# Patient Record
Sex: Male | Born: 1970 | Race: White | Hispanic: No | Smoking: Never smoker
Health system: Southern US, Community
[De-identification: ages and names within clinical notes are randomized; demographics above are authoritative.]

## PROBLEM LIST (undated history)

## (undated) DIAGNOSIS — R011 Cardiac murmur, unspecified: Secondary | ICD-10-CM

## (undated) DIAGNOSIS — I219 Acute myocardial infarction, unspecified: Secondary | ICD-10-CM

## (undated) DIAGNOSIS — M109 Gout, unspecified: Secondary | ICD-10-CM

## (undated) DIAGNOSIS — I10 Essential (primary) hypertension: Secondary | ICD-10-CM

## (undated) DIAGNOSIS — I2510 Atherosclerotic heart disease of native coronary artery without angina pectoris: Secondary | ICD-10-CM

## (undated) HISTORY — DX: Atherosclerotic heart disease of native coronary artery without angina pectoris: I25.10

## (undated) HISTORY — DX: Acute myocardial infarction, unspecified: I21.9

## (undated) HISTORY — DX: Cardiac murmur, unspecified: R01.1

## (undated) HISTORY — PX: CORONARY ARTERY BYPASS GRAFT: SHX141

---

## 2012-01-19 ENCOUNTER — Emergency Department (HOSPITAL_COMMUNITY)
Admission: EM | Admit: 2012-01-19 | Discharge: 2012-01-19 | Disposition: A | Discharge status: Home / Self Care | Payer: Self-pay | Attending: Emergency Medicine | Admitting: Emergency Medicine

## 2012-01-19 ENCOUNTER — Encounter (HOSPITAL_COMMUNITY): Payer: Self-pay | Admitting: Emergency Medicine

## 2012-01-19 ENCOUNTER — Emergency Department (HOSPITAL_COMMUNITY): Payer: Self-pay

## 2012-01-19 DIAGNOSIS — Z79899 Other long term (current) drug therapy: Secondary | ICD-10-CM | POA: Insufficient documentation

## 2012-01-19 DIAGNOSIS — Z8639 Personal history of other endocrine, nutritional and metabolic disease: Secondary | ICD-10-CM | POA: Insufficient documentation

## 2012-01-19 DIAGNOSIS — J039 Acute tonsillitis, unspecified: Secondary | ICD-10-CM | POA: Insufficient documentation

## 2012-01-19 DIAGNOSIS — Z862 Personal history of diseases of the blood and blood-forming organs and certain disorders involving the immune mechanism: Secondary | ICD-10-CM | POA: Insufficient documentation

## 2012-01-19 DIAGNOSIS — K029 Dental caries, unspecified: Secondary | ICD-10-CM | POA: Insufficient documentation

## 2012-01-19 DIAGNOSIS — I10 Essential (primary) hypertension: Secondary | ICD-10-CM | POA: Insufficient documentation

## 2012-01-19 HISTORY — DX: Gout, unspecified: M10.9

## 2012-01-19 HISTORY — DX: Essential (primary) hypertension: I10

## 2012-01-19 LAB — CBC WITH DIFFERENTIAL/PLATELET
Eosinophils Absolute: 0 10*3/uL (ref 0.0–0.7)
Eosinophils Relative: 0 % (ref 0–5)
Hemoglobin: 16.5 g/dL (ref 13.0–17.0)
Lymphocytes Relative: 4 % — ABNORMAL LOW (ref 12–46)
Lymphs Abs: 0.8 10*3/uL (ref 0.7–4.0)
MCH: 27.6 pg (ref 26.0–34.0)
MCV: 80.1 fL (ref 78.0–100.0)
Monocytes Relative: 8 % (ref 3–12)
Platelets: 207 10*3/uL (ref 150–400)
RBC: 5.98 MIL/uL — ABNORMAL HIGH (ref 4.22–5.81)
WBC: 20.2 10*3/uL — ABNORMAL HIGH (ref 4.0–10.5)

## 2012-01-19 LAB — BASIC METABOLIC PANEL
BUN: 16 mg/dL (ref 6–23)
CO2: 25 mEq/L (ref 19–32)
Glucose, Bld: 121 mg/dL — ABNORMAL HIGH (ref 70–99)
Potassium: 3.6 mEq/L (ref 3.5–5.1)
Sodium: 138 mEq/L (ref 135–145)

## 2012-01-19 MED ORDER — HYDROMORPHONE HCL PF 1 MG/ML IJ SOLN
1.0000 mg | Freq: Once | INTRAMUSCULAR | Status: AC
  Administered 2012-01-19: 1 mg via INTRAVENOUS
  Filled 2012-01-19: qty 1

## 2012-01-19 MED ORDER — CLINDAMYCIN PHOSPHATE 600 MG/50ML IV SOLN
600.0000 mg | Freq: Once | INTRAVENOUS | Status: AC
  Administered 2012-01-19: 600 mg via INTRAVENOUS
  Filled 2012-01-19: qty 50

## 2012-01-19 MED ORDER — AMOXICILLIN-POT CLAVULANATE 875-125 MG PO TABS: 1.0000 | ORAL_TABLET | Freq: Two times a day (BID) | ORAL | Status: DC

## 2012-01-19 MED ORDER — LISINOPRIL 10 MG PO TABS: 10.0000 mg | ORAL_TABLET | Freq: Every day | ORAL | Status: DC

## 2012-01-19 MED ORDER — LISINOPRIL 10 MG PO TABS
10.0000 mg | ORAL_TABLET | Freq: Once | ORAL | Status: AC
  Administered 2012-01-19: 10 mg via ORAL
  Filled 2012-01-19: qty 1

## 2012-01-19 MED ORDER — AMOXICILLIN-POT CLAVULANATE 875-125 MG PO TABS
1.0000 | ORAL_TABLET | Freq: Once | ORAL | Status: AC
  Administered 2012-01-19: 1 via ORAL
  Filled 2012-01-19: qty 1

## 2012-01-19 MED ORDER — SODIUM CHLORIDE 0.9 % IV SOLN
INTRAVENOUS | Status: DC
  Administered 2012-01-19: 20:00:00 via INTRAVENOUS

## 2012-01-19 MED ORDER — OXYCODONE-ACETAMINOPHEN 5-325 MG PO TABS: 1.0000 | ORAL_TABLET | ORAL | Status: DC | PRN

## 2012-01-19 MED ORDER — ONDANSETRON HCL 4 MG/2ML IJ SOLN
4.0000 mg | Freq: Once | INTRAMUSCULAR | Status: AC
  Administered 2012-01-19: 4 mg via INTRAVENOUS
  Filled 2012-01-19: qty 2

## 2012-01-19 MED ORDER — LISINOPRIL 10 MG PO TABS
ORAL_TABLET | ORAL | Status: AC
  Filled 2012-01-19: qty 1

## 2012-01-19 MED ORDER — IOHEXOL 300 MG/ML  SOLN
70.0000 mL | Freq: Once | INTRAMUSCULAR | Status: AC | PRN
  Administered 2012-01-19: 70 mL via INTRAVENOUS

## 2012-01-19 NOTE — ED Notes (Signed)
Patient sleeping at this time. Arousable to speech. Denies pain at this time.

## 2012-01-19 NOTE — ED Notes (Signed)
Pt presents with redness and swelling to left side of face. Pt also reports difficulty swallowing. Denies difficulty breathing. Pt was seen at [facility] facility and told to come to ED for IV abx.

## 2012-01-19 NOTE — ED Notes (Signed)
Notified Dr. Effie Shy of patient's vital signs, including oxygen saturating dropping to 90% on room air. Patient placed on 2 liters of oxygen via nasal canula.

## 2012-01-19 NOTE — ED Notes (Signed)
Tiffany abbott, rn stated that blood pressure taken at 1641 was supposed to be 181/125 instead of 181/25

## 2012-01-19 NOTE — ED Provider Notes (Signed)
History     CSN: [id number]  Arrival date & time 01/19/12  1632   First MD Initiated Contact with Patient 01/19/12 1847      Chief Complaint  Patient presents with  . Abscess    (Consider location/radiation/quality/duration/timing/severity/associated sxs/prior treatment) HPI Comments: Jerry Young is a 41 y.o. Male  Who began having swelling, and pain in the left jaw, yesterday. Today. It swelled on the right beneath the mandible. He has chills without fever. He denies nausea, or vomiting. He has not had any recent dental care. He saw a doctor at an urgent care, who recommended. ER followup for evaluation and treatment. He has not had this previously. He's not taken any medication for the problem. It hurts to open the mouth, and hurts to touch the jaw.  Patient is a 41 y.o. male presenting with abscess. The history is provided by the patient.  Abscess     Past Medical History  Diagnosis Date  . Hypertension   . Gout     History reviewed. No pertinent past surgical history.  History reviewed. No pertinent family history.  History  Substance Use Topics  . Smoking status: Not on file  . Smokeless tobacco: Not on file  . Alcohol Use:       Review of Systems  All other systems reviewed and are negative.    Allergies  Review of patient's allergies indicates no known allergies.  Home Medications   Current Outpatient Rx  Name  Route  Sig  Dispense  Refill  . ACETAMINOPHEN 500 MG PO TABS   Oral   Take 1,500-2,000 mg by mouth every 6 (six) hours as needed. For pain         . IBUPROFEN 200 MG PO TABS   Oral   Take 600 mg by mouth every 6 (six) hours as needed. For pain         . AMOXICILLIN-POT CLAVULANATE 875-125 MG PO TABS   Oral   Take 1 tablet by mouth 2 (two) times daily.   20 tablet   0   . LISINOPRIL 10 MG PO TABS   Oral   Take 1 tablet (10 mg total) by mouth daily.   30 tablet   0   . OXYCODONE-ACETAMINOPHEN 5-325 MG PO TABS   Oral   Take 1  tablet by mouth every 4 (four) hours as needed for pain.   30 tablet   0   . OXYCODONE-ACETAMINOPHEN 5-325 MG PO TABS   Oral   Take 1 tablet by mouth every 4 (four) hours as needed for pain.   6 tablet   0     BP 208/124  Pulse 112  Temp 99.9 F (37.7 C) (Oral)  Resp 18  SpO2 93%  Physical Exam  Nursing note and vitals reviewed. Constitutional: He is oriented to person, place, and time. He appears well-developed and well-nourished. No distress.  HENT:  Head: Normocephalic and atraumatic.  Right Ear: External ear normal.  Left Ear: External ear normal.       Poor dentition. Large cavity left lower premolar. No associated swelling or abscess. Mild trismus secondary to left jaw pain. Mild, left to right submandibular swelling, indistinct without palpable abscess.  Eyes: Conjunctivae normal and EOM are normal. Pupils are equal, round, and reactive to light.  Neck: Normal range of motion and phonation normal. Neck supple.  Cardiovascular: Normal rate, regular rhythm, normal heart sounds and intact distal pulses.   Pulmonary/Chest: Effort normal and breath sounds  normal. He exhibits no bony tenderness.  Abdominal: Soft. Normal appearance. There is no tenderness.  Musculoskeletal: Normal range of motion.  Neurological: He is alert and oriented to person, place, and time. He has normal strength. No cranial nerve deficit or sensory deficit. He exhibits normal muscle tone. Coordination normal.  Skin: Skin is warm, dry and intact.  Psychiatric: He has a normal mood and affect. His behavior is normal. Judgment and thought content normal.    ED Course  Procedures (including critical care time)  Emergency department treatment: IV fluids, and IV clindamycin, IV Dilaudid, and Zofran.  Reevaluation: 21:20- he feels better after treatment. He is not drooling. He is able to walk to the bathroom.   Labs Reviewed  CBC WITH DIFFERENTIAL - Abnormal; Notable for the following:    WBC 20.2 (*)      RBC 5.98 (*)     Neutrophils Relative 88 (*)     Neutro Abs 17.8 (*)     Lymphocytes Relative 4 (*)     Monocytes Absolute 1.5 (*)     All other components within normal limits  BASIC METABOLIC PANEL - Abnormal; Notable for the following:    Glucose, Bld 121 (*)     GFR calc non Af Amer 72 (*)     GFR calc Af Amer 83 (*)     All other components within normal limits   Ct Soft Tissue Neck W Contrast  01/19/2012  *RADIOLOGY REPORT*  Clinical Data: Dental infection.  Left submandibular swelling and pain.  CT NECK WITH CONTRAST  Technique:  Multidetector CT imaging of the neck was performed with intravenous contrast.  Contrast: 70mL OMNIPAQUE IOHEXOL 300 MG/ML  SOLN  Comparison: None.  Findings: Asymmetric soft tissue swelling is seen involving the left submandibular salivary gland with adjacent inflammatory changes.  Shotty left submandibular and left upper internal jugular lymph nodes are also seen, measuring up to 11 mm in short axis, likely reactive in etiology. No abscess is identified.  There is mild asymmetric soft tissue swelling of the left palatine tonsil, however there is no evidence of peritonsillar abscess.  The other salivary glands and thyroid are normal in appearance.  Larynx and epiglottis are normal appearance.  IMPRESSION:  1.  Asymmetric swelling of the left submandibular gland and palatine tonsil, with shotty left submandibular and upper internal jugular lymphadenopathy. 2.  No evidence of abscess.   Original Report Authenticated By: Myles Rosenthal, M.D.      1. Tonsillitis   2. Dental caries   3. Hypertension       MDM  Nonspecific symptoms, with equivocal  CT findings. Unsuspected tonsillar swelling possibly consistent with streptococcal tonsillitis. Cannot rule out dental source for infection. No apparent abscess or impending airway obstruction. Antibiotics will be changed to Augmentin to cover for both dental and pharyngeal sources for infection. Doubt sepsis or  metabolic instability.    Plan: Home Medications- Augmentin, Percocet, Lisinopril; Home Treatments- Rest; Recommended follow up- Return for check up in 2 days    Flint Melter, MD 01/20/12 [phone]

## 2012-01-23 MED FILL — Oxycodone w/ Acetaminophen Tab 5-325 MG: ORAL | Qty: 6 | Status: AC

## 2016-03-05 ENCOUNTER — Encounter (HOSPITAL_COMMUNITY): Payer: Self-pay | Admitting: Emergency Medicine

## 2016-03-05 ENCOUNTER — Emergency Department (HOSPITAL_COMMUNITY)
Admission: EM | Admit: 2016-03-05 | Discharge: 2016-03-05 | Disposition: A | Discharge status: Home / Self Care | Payer: Managed Care, Other (non HMO) | Attending: Emergency Medicine | Admitting: Emergency Medicine

## 2016-03-05 DIAGNOSIS — M10061 Idiopathic gout, right knee: Secondary | ICD-10-CM | POA: Insufficient documentation

## 2016-03-05 DIAGNOSIS — I10 Essential (primary) hypertension: Secondary | ICD-10-CM | POA: Diagnosis not present

## 2016-03-05 DIAGNOSIS — F17220 Nicotine dependence, chewing tobacco, uncomplicated: Secondary | ICD-10-CM | POA: Diagnosis not present

## 2016-03-05 DIAGNOSIS — Z791 Long term (current) use of non-steroidal anti-inflammatories (NSAID): Secondary | ICD-10-CM | POA: Diagnosis not present

## 2016-03-05 DIAGNOSIS — Z79899 Other long term (current) drug therapy: Secondary | ICD-10-CM | POA: Insufficient documentation

## 2016-03-05 DIAGNOSIS — M25561 Pain in right knee: Secondary | ICD-10-CM | POA: Diagnosis present

## 2016-03-05 DIAGNOSIS — M109 Gout, unspecified: Secondary | ICD-10-CM

## 2016-03-05 MED ORDER — PREDNISONE 50 MG PO TABS
60.0000 mg | ORAL_TABLET | Freq: Once | ORAL | Status: AC
  Administered 2016-03-05: 60 mg via ORAL
  Filled 2016-03-05: qty 1

## 2016-03-05 MED ORDER — KETOROLAC TROMETHAMINE 30 MG/ML IJ SOLN
30.0000 mg | Freq: Once | INTRAMUSCULAR | Status: DC
  Filled 2016-03-05: qty 1

## 2016-03-05 MED ORDER — KETOROLAC TROMETHAMINE 30 MG/ML IJ SOLN
30.0000 mg | Freq: Once | INTRAMUSCULAR | Status: AC
  Administered 2016-03-05: 30 mg via INTRAMUSCULAR

## 2016-03-05 MED ORDER — PREDNISONE 10 MG PO TABS: ORAL_TABLET | ORAL | 0 refills | Status: DC

## 2016-03-05 NOTE — ED Triage Notes (Signed)
PT c/o right knee pain with no new injury that started x3-4 days ago. PT states he has a hx of gout and it tends to flare up to his right knee at times.

## 2016-03-05 NOTE — ED Provider Notes (Signed)
[facility]ER[facility] DEPT Provider Note   CSN: [id number] Arrival date & time: 03/05/16  1017     History   Chief Complaint Chief Complaint  Patient presents with  . Knee Pain    HPI Jerry Young is a 46 y.o. male with history of gout presents with right knee pain x 4 days. He describes it as worsening, sharp, radiating pain down his leg and up his leg, 10/10 pain. He states that moving his leg makes pain worse. He reports being able to slightly move his leg and wiggle his toes. Patient has tried appropriate at home with no relief. Patient reports that he has episodes "exactly" like this about every 6 months to the same knee and feels exactly same as every other episode, and he has had this for about 25 years. He states that he usually goes to the urgent care but was not able to since his urgent care was not open today. Patient reports his primary care physician monitors his gout. He reports never having his knee aspirated any of his previous gout attacks. Patient has not had any pain medication today. Patient denies trauma, fevers, chills, short of breath, chest pain, nausea, vomiting.  The history is provided by the patient. No language interpreter was used.    Past Medical History:  Diagnosis Date  . Gout   . Hypertension     There are no active problems to display for this patient.   History reviewed. No pertinent surgical history.     Home Medications    Prior to Admission medications   Medication Sig Start Date End Date Taking? Authorizing Provider  acetaminophen (TYLENOL) 500 MG tablet Take 1,500-2,000 mg by mouth every 6 (six) hours as needed. For pain    Historical Provider, MD  amoxicillin-clavulanate (AUGMENTIN) 875-125 MG per tablet Take 1 tablet by mouth 2 (two) times daily. 01/19/12   Mancel Bale, MD  ibuprofen (ADVIL,MOTRIN) 200 MG tablet Take 600 mg by mouth every 6 (six) hours as needed. For pain    Historical Provider, MD  lisinopril (PRINIVIL,ZESTRIL) 10 MG  tablet Take 1 tablet (10 mg total) by mouth daily. 01/19/12   Mancel Bale, MD  oxyCODONE-acetaminophen (PERCOCET) 5-325 MG per tablet Take 1 tablet by mouth every 4 (four) hours as needed for pain. 01/19/12   Mancel Bale, MD  oxyCODONE-acetaminophen (PERCOCET/ROXICET) 5-325 MG per tablet Take 1 tablet by mouth every 4 (four) hours as needed for pain. 01/19/12   Mancel Bale, MD  predniSONE (DELTASONE) 10 MG tablet Take 6 tabs day 1 and 2, 5 tabs day 3 and 4, 4 tabs day 5 and 6, 3 tabs day 7 and 8, 2 tabs day 9 and 10, and 1 tablet on day 11 and 12 03/05/16   Jerry Young, [place]    Family History History reviewed. No pertinent family history.  Social History Social History  Substance Use Topics  . Smoking status: Never Smoker  . Smokeless tobacco: Current User    Types: Chew  . Alcohol use No     Allergies   Patient has no known allergies.   Review of Systems Review of Systems  Constitutional: Negative for chills and fever.  Respiratory: Negative for shortness of breath.   Cardiovascular: Negative for chest pain.  Gastrointestinal: Negative for abdominal pain, diarrhea, nausea and vomiting.  Genitourinary: Negative for difficulty urinating and dysuria.  Musculoskeletal: Positive for arthralgias (Right knee), gait problem (secondary to pain) and myalgias (secondary to right knee joint pain). Negative for  neck pain and neck stiffness.  Skin: Negative for color change, rash and wound.     Physical Exam Updated Vital Signs BP (!) 169/111   Pulse 99   Temp 97.8 F (36.6 C) (Oral)   Resp 16   Ht 5\' 10"  (1.778 m)   Wt 95.3 kg   SpO2 96%   BMI 30.13 kg/m   Physical Exam  Constitutional: He is oriented to person, place, and time. He appears well-developed and well-nourished.  Well-appearing. Uncomfortable when moving right knee.  HENT:  Head: Normocephalic and atraumatic.  Eyes: EOM are normal.  Neck: Normal range of motion.  Cardiovascular: Normal rate, normal  heart sounds and intact distal pulses.   Pulmonary/Chest: Effort normal and breath sounds normal. No respiratory distress.  Abdominal: Soft. There is no tenderness. There is no rebound and no guarding.  Musculoskeletal: He exhibits tenderness.  Right knee his swollen, warm, and tender to palpation. No redness to right knee.  Neurological: He is alert and oriented to person, place, and time.  Skin: Skin is warm. No rash noted. No erythema.  Psychiatric: He has a normal mood and affect. His behavior is normal.  Nursing note and vitals reviewed.    ED Treatments / Results  Labs (all labs ordered are listed, but only abnormal results are displayed) Labs Reviewed - No data to display  EKG  EKG Interpretation None       Radiology No results found.  Procedures Procedures (including critical care time)  Medications Ordered in ED Medications  predniSONE (DELTASONE) tablet 60 mg (60 mg Oral Given 03/05/16 1359)  ketorolac (TORADOL) 30 MG/ML injection 30 mg (30 mg Intramuscular Given 03/05/16 1359)     Initial Impression / Assessment and Plan / ED Course  I have reviewed the triage vital signs and the nursing notes.  Pertinent labs & imaging results that were available during my care of the patient were reviewed by me and considered in my medical decision making (see chart for details).    Pt presents with monoarticular knee pain, swelling. No erythema.  Pt is afebrile and stable. Patient denied trauma and states this is the exact same presentation as it usually is about every 6 months. Pt Given Toradol and prednisone here in the ED, which he states he normally gets at the urgent care and helps him every time he has these symptoms.  Pt dc with prednisone for 12 days on a tapered dose due to significant history of gout and presentation. Advised that a longer dose of prednisone is indicated with his symptoms to prevent rebound gout. He has already tried ibuprofen at home with no relief.  Elevated blood pressure likely secondary to pain. Patient agreed with assessment and plan. Patient advised to, Dr. Romeo AppleHarrison on Monday for follow-up on today's visit. Patient also advised to follow up with primary care physician in 2-5 days for today's visit and for blood pressure treatment. Return precautions given for any new or worsening symptoms.   Final Clinical Impressions(s) / ED Diagnoses   Final diagnoses:  Acute gout of right knee, unspecified cause    New Prescriptions Discharge Medication List as of 03/05/2016  2:26 PM    START taking these medications   Details  predniSONE (DELTASONE) 10 MG tablet Take 6 tabs day 1 and 2, 5 tabs day 3 and 4, 4 tabs day 5 and 6, 3 tabs day 7 and 8, 2 tabs day 9 and 10, and 1 tablet on day 11 and 12, Print  [address] [place], [place] 03/05/16 1738    Cathren Laine, MD 03/06/16 1011

## 2016-03-05 NOTE — Discharge Instructions (Signed)
Please take prednisone as prescribed. It is a tapered dose needs to be taken every single day as shown. Use cold compress to the knee. Please follow up with orthopedic in 2 days due to frequency. Please follow up with her primary care physician in a week as needed. Maintain proper gout diet.  Contact a health care provider if: You have another gout attack. You continue to have symptoms of a gout attack after10 days of treatment. You have side effects from your medicines. You have chills or a fever. You have burning pain when you urinate. You have pain in your lower back or belly. Get help right away if: You have severe or uncontrolled pain. You cannot urinate.

## 2017-12-29 ENCOUNTER — Ambulatory Visit: Payer: Self-pay | Admitting: Family Medicine

## 2017-12-29 VITALS — BP 150/82 | HR 99 | Temp 98.6°F | Wt 214.8 lb

## 2017-12-29 DIAGNOSIS — M109 Gout, unspecified: Secondary | ICD-10-CM

## 2017-12-29 MED ORDER — PREDNISONE 20 MG PO TABS: 60.0000 mg | ORAL_TABLET | Freq: Every day | ORAL | 0 refills | Status: AC

## 2017-12-29 MED ORDER — PREDNISONE 10 MG (21) PO TBPK: ORAL_TABLET | ORAL | 0 refills | Status: DC

## 2017-12-29 NOTE — Progress Notes (Signed)
Jerry Young is a 47 y.o. male who presents today with concerns of pain related to a gout flare he reports not having a primary care provider for some time and treating his flares of gout every so often at an urgent care location. He reports a comorbid condition of hypertension that he is not currently medicated for. He reports increasing pain and symptoms for the last 4 days. He reports he believed that he could get through flare without treatment.  Review of Systems  Constitutional: Negative for chills, fever and malaise/fatigue.  HENT: Negative for congestion, ear discharge, ear pain, sinus pain and sore throat.   Eyes: Negative.   Respiratory: Negative for cough, sputum production and shortness of breath.   Cardiovascular: Negative.  Negative for chest pain.  Gastrointestinal: Negative for abdominal pain, diarrhea, nausea and vomiting.  Genitourinary: Negative for dysuria, frequency, hematuria and urgency.  Musculoskeletal: Positive for joint pain. Negative for myalgias.  Skin: Negative.   Neurological: Negative for headaches.  Endo/Heme/Allergies: Negative.   Psychiatric/Behavioral: Negative.     O: Vitals:   12/29/17 1213  BP: (!) 150/82  Pulse: 99  Temp: 98.6 F (37 C)     Physical Exam  Constitutional: He is oriented to person, place, and time. Vital signs are normal. He appears well-developed and well-nourished. He is active.  Non-toxic appearance. He does not have a sickly appearance.  HENT:  Head: Normocephalic.  Right Ear: Hearing, tympanic membrane, external ear and ear canal normal.  Left Ear: Hearing, tympanic membrane, external ear and ear canal normal.  Nose: Nose normal.  Mouth/Throat: Uvula is midline and oropharynx is clear and moist.  Neck: Normal range of motion. Neck supple.  Cardiovascular: Normal rate, regular rhythm, normal heart sounds and normal pulses.  Pulmonary/Chest: Effort normal and breath sounds normal.  Abdominal: Soft. Bowel sounds are  normal.  Musculoskeletal: Normal range of motion.       Right forearm: He exhibits tenderness, bony tenderness, swelling, edema and deformity.       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   Lymphadenopathy:       Head (right side): No submental and no submandibular adenopathy present.       Head (left side): No submental and no submandibular adenopathy present.    He has no cervical adenopathy.  Neurological: He is alert and oriented to person, place, and time.  Psychiatric: He has a normal mood and affect.  Vitals reviewed.  A: 1. Acute gout of right elbow, unspecified cause    P: Discussed exam findings, diagnosis etiology and medication use and indications reviewed with patient. Follow- Up and discharge instructions provided. No emergent/urgent issues found on exam.  Patient verbalized understanding of information provided and agrees with plan of care (POC), all questions answered.  Discussed concern due to the frequency of care and the lack of a PCP potentially causing more frequent exacerbations than necessary and the risk to joint damage and destruction due to elevated uric acid levels. Provided patient with multiple low cost treatment options due to lack of insurance currently. Treated with OTC NSAID regimen and prednisone. Discussed the benefit of daily allopurinol and patient reports historically multiple treatments attempted without real reduction in exacerbations or blood levels. Provided information of diet modification and [facility].  1. Acute gout of right elbow, unspecified cause -  predniSONE (DELTASONE) 20 MG tablet; Take 3 tablets (60 mg total) by mouth daily with breakfast for 3 days. - predniSONE (STERAPRED UNI-PAK 21 TAB) 10 MG (21) TBPK tablet; Start after 3 day burst of prednisone 60 mg.  Take 6 tablets on day 1 and then 1 tablet each day thereafter. 6,5,4,3,2,1

## 2017-12-29 NOTE — Patient Instructions (Signed)

## 2018-01-01 ENCOUNTER — Telehealth: Payer: Self-pay

## 2018-01-01 NOTE — Telephone Encounter (Signed)
I was no able to contacted the patient. 

## 2019-07-24 ENCOUNTER — Emergency Department (HOSPITAL_COMMUNITY)
Admission: EM | Admit: 2019-07-24 | Discharge: 2019-07-25 | Disposition: A | Discharge status: Home / Self Care | Payer: BLUE CROSS/BLUE SHIELD | Attending: Emergency Medicine | Admitting: Emergency Medicine

## 2019-07-24 ENCOUNTER — Emergency Department (HOSPITAL_COMMUNITY): Payer: BLUE CROSS/BLUE SHIELD

## 2019-07-24 ENCOUNTER — Other Ambulatory Visit: Payer: Self-pay

## 2019-07-24 ENCOUNTER — Encounter (HOSPITAL_COMMUNITY): Payer: Self-pay | Admitting: Emergency Medicine

## 2019-07-24 DIAGNOSIS — Z79899 Other long term (current) drug therapy: Secondary | ICD-10-CM | POA: Insufficient documentation

## 2019-07-24 DIAGNOSIS — N289 Disorder of kidney and ureter, unspecified: Secondary | ICD-10-CM

## 2019-07-24 DIAGNOSIS — F17220 Nicotine dependence, chewing tobacco, uncomplicated: Secondary | ICD-10-CM | POA: Diagnosis not present

## 2019-07-24 DIAGNOSIS — M25562 Pain in left knee: Secondary | ICD-10-CM | POA: Insufficient documentation

## 2019-07-24 DIAGNOSIS — M109 Gout, unspecified: Secondary | ICD-10-CM | POA: Diagnosis not present

## 2019-07-24 DIAGNOSIS — I10 Essential (primary) hypertension: Secondary | ICD-10-CM | POA: Insufficient documentation

## 2019-07-24 DIAGNOSIS — M25569 Pain in unspecified knee: Secondary | ICD-10-CM

## 2019-07-24 NOTE — ED Triage Notes (Signed)
Pt presents to ED POV. Pt hx of gout states he has flare up in L knee. Pt states pain is so severe he can not walk on it. Pt could not be seen by PCP.

## 2019-07-25 LAB — BASIC METABOLIC PANEL
Anion gap: 16 — ABNORMAL HIGH (ref 5–15)
BUN: 27 mg/dL — ABNORMAL HIGH (ref 6–20)
CO2: 20 mmol/L — ABNORMAL LOW (ref 22–32)
Calcium: 9 mg/dL (ref 8.9–10.3)
Chloride: 94 mmol/L — ABNORMAL LOW (ref 98–111)
Creatinine, Ser: 1.67 mg/dL — ABNORMAL HIGH (ref 0.61–1.24)
GFR calc Af Amer: 54 mL/min — ABNORMAL LOW (ref >60)
GFR calc non Af Amer: 47 mL/min — ABNORMAL LOW (ref >60)
Glucose, Bld: 92 mg/dL (ref 70–99)
Potassium: 4.1 mmol/L (ref 3.5–5.1)
Sodium: 130 mmol/L — ABNORMAL LOW (ref 135–145)

## 2019-07-25 LAB — CBC
HCT: 47.3 % (ref 39.0–52.0)
Hemoglobin: 15.2 g/dL (ref 13.0–17.0)
MCH: 27.8 pg (ref 26.0–34.0)
MCHC: 32.1 g/dL (ref 30.0–36.0)
MCV: 86.5 fL (ref 80.0–100.0)
Platelets: 283 10*3/uL (ref 150–400)
RBC: 5.47 MIL/uL (ref 4.22–5.81)
RDW: 13.4 % (ref 11.5–15.5)
WBC: 10.9 10*3/uL — ABNORMAL HIGH (ref 4.0–10.5)
nRBC: 0 % (ref 0.0–0.2)

## 2019-07-25 MED ORDER — PREDNISONE 50 MG PO TABS: 50.0000 mg | ORAL_TABLET | Freq: Every day | ORAL | 0 refills | Status: DC

## 2019-07-25 MED ORDER — HYDROCODONE-ACETAMINOPHEN 5-325 MG PO TABS: 1.0000 | ORAL_TABLET | ORAL | 0 refills | Status: DC | PRN

## 2019-07-25 MED ORDER — OXYCODONE-ACETAMINOPHEN 5-325 MG PO TABS
1.0000 | ORAL_TABLET | Freq: Once | ORAL | Status: AC
  Administered 2019-07-25: 1 via ORAL
  Filled 2019-07-25: qty 1

## 2019-07-25 MED ORDER — COLCHICINE 0.6 MG PO CAPS: 0.6000 mg | ORAL_CAPSULE | Freq: Every day | ORAL | 0 refills | Status: DC

## 2019-07-25 MED ORDER — COLCHICINE 0.6 MG PO TABS
0.6000 mg | ORAL_TABLET | Freq: Once | ORAL | Status: AC
  Administered 2019-07-25: 0.6 mg via ORAL
  Filled 2019-07-25: qty 1

## 2019-07-25 MED ORDER — PREDNISONE 20 MG PO TABS
60.0000 mg | ORAL_TABLET | Freq: Once | ORAL | Status: AC
  Administered 2019-07-25: 60 mg via ORAL
  Filled 2019-07-25: qty 3

## 2019-07-25 NOTE — Discharge Instructions (Signed)
When you see your rheumatologist, please discuss treatment options to try to prevent future flareups of your gout.

## 2019-07-25 NOTE — ED Provider Notes (Signed)
[facility] EMERGENCY DEPARTMENT Provider Note   CSN: [id number] Arrival date & time: 07/24/19  2310   History Chief Complaint  Patient presents with  . Knee Pain    Jerry Young is a 49 y.o. male.  The history is provided by the patient.  Knee Pain He has history of hypertension and gout and comes in with a 3-day history of pain in his left knee typical of his gout.  He has had several flares of his gout recently.  He is supposed to see a rheumatologist, but initial appointment has not yet been made.  He denies any trauma.  He denies fever or chills.  Pain is rated at 10/10.  He has tried taking ibuprofen, but without relief.  Past Medical History:  Diagnosis Date  . Gout   . Hypertension     There are no problems to display for this patient.   History reviewed. No pertinent surgical history.     History reviewed. No pertinent family history.  Social History   Tobacco Use  . Smoking status: Never Smoker  . Smokeless tobacco: Current User    Types: Chew  Substance Use Topics  . Alcohol use: No  . Drug use: No    Home Medications Prior to Admission medications   Medication Sig Start Date End Date Taking? Authorizing Provider  acetaminophen (TYLENOL) 500 MG tablet Take 1,500-2,000 mg by mouth every 6 (six) hours as needed. For pain    [provider]  amoxicillin-clavulanate (AUGMENTIN) 875-125 MG per tablet Take 1 tablet by mouth 2 (two) times daily. Patient not taking: Reported on 12/29/2017 01/19/12   Daleen Bo, MD  ibuprofen (ADVIL,MOTRIN) 200 MG tablet Take 600 mg by mouth every 6 (six) hours as needed. For pain    [provider]  lisinopril (PRINIVIL,ZESTRIL) 10 MG tablet Take 1 tablet (10 mg total) by mouth daily. Patient not taking: Reported on 12/29/2017 01/19/12   Daleen Bo, MD  oxyCODONE-acetaminophen (PERCOCET) 5-325 MG per tablet Take 1 tablet by mouth every 4 (four) hours as needed for pain. Patient not  taking: Reported on 12/29/2017 01/19/12   Daleen Bo, MD  oxyCODONE-acetaminophen (PERCOCET/ROXICET) 5-325 MG per tablet Take 1 tablet by mouth every 4 (four) hours as needed for pain. Patient not taking: Reported on 12/29/2017 01/19/12   Daleen Bo, MD  predniSONE (STERAPRED UNI-PAK 21 TAB) 10 MG (21) TBPK tablet Start after 3 day burst of prednisone 60 mg. Take 6 tablets on day 1 and then 1 tablet each day thereafter. 0,3,4,7,4,2 12/29/17   Shella Maxim, NP    Allergies    Patient has no known allergies.  Review of Systems   Review of Systems  All other systems reviewed and are negative.   Physical Exam Updated Vital Signs BP 110/88   Pulse 88   Temp 98 F (36.7 C) (Oral)   Resp 18   Ht 5\' 11"  (1.803 m)   Wt 90.7 kg   SpO2 100%   BMI 27.89 kg/m   Physical Exam Vitals and nursing note reviewed.   49 year old male, resting comfortably and in no acute distress. Vital signs are normal. Oxygen saturation is 100%, which is normal. Head is normocephalic and atraumatic. PERRLA, EOMI. Oropharynx is clear. Neck is nontender and supple without adenopathy or JVD. Back is nontender and there is no CVA tenderness. Lungs are clear without rales, wheezes, or rhonchi. Chest is nontender. Heart has regular rate and rhythm without murmur. Abdomen is soft,  flat, nontender without masses or hepatosplenomegaly and peristalsis is normoactive. Extremities: Left knee has a moderate to large effusion with mild warmth but no erythema.  It is tender to palpation.  Remainder of extremity exam is unremarkable. Skin is warm and dry without rash. Neurologic: Mental status is normal, cranial nerves are intact, there are no motor or sensory deficits.  ED Results / Procedures / Treatments   Labs (all labs ordered are listed, but only abnormal results are displayed) Labs Reviewed  CBC - Abnormal; Notable for the following components:      Result Value   WBC 10.9 (*)    All other components  within normal limits  BASIC METABOLIC PANEL - Abnormal; Notable for the following components:   Sodium 130 (*)    Chloride 94 (*)    CO2 20 (*)    BUN 27 (*)    Creatinine, Ser 1.67 (*)    GFR calc non Af Amer 47 (*)    GFR calc Af Amer 54 (*)    Anion gap 16 (*)    All other components within normal limits   Radiology DG Knee 1-2 Views Left  Result Date: 07/24/2019 CLINICAL DATA:  Left knee pain. EXAM: LEFT KNEE - 1-2 VIEW COMPARISON:  None. FINDINGS: No evidence of an acute fracture or dislocation. There is mild medial tibiofemoral compartment space narrowing. Mild to moderate severity lateral tibiofemoral compartment space narrowing is also seen. A small to moderate sized joint effusion is noted. A 3.9 cm x 2.2 cm focal area of soft tissue swelling is seen anterior to the inferior aspect of the left patella. A 1.7 cm x 8.9 cm curvilinear area of mildly increased opacification is seen overlying the posterior soft tissues on the lateral view. IMPRESSION: 1. Mild to moderate severity degenerative changes. 2. Small to moderate sized joint effusion. 3. Focal area of soft tissue swelling anterior to the inferior aspect of the left patella. 4. Curvilinear area of mildly increased opacification overlying the posterior soft tissues of the left knee. MRI correlation is recommended. Electronically Signed   By: Aram Candela M.D.   On: 07/24/2019 23:48    Procedures Procedures  Medications Ordered in ED Medications  oxyCODONE-acetaminophen (PERCOCET/ROXICET) 5-325 MG per tablet 1 tablet (1 tablet Oral Given 07/25/19 0424)  predniSONE (DELTASONE) tablet 60 mg (60 mg Oral Given 07/25/19 0424)  colchicine tablet 0.6 mg (0.6 mg Oral Given 07/25/19 0424)    ED Course  I have reviewed the triage vital signs and the nursing notes.  Pertinent labs & imaging results that were available during my care of the patient were reviewed by me and considered in my medical decision making (see chart for  details).  MDM Rules/Calculators/A&P Acute gouty arthritis of the left knee.  Old records are reviewed, and he has been seen for gout flareups 2 other times in the last 2 months, each time treated with a course of prednisone.  He does have documented elevated uric acid levels on record.  X-ray shows degenerative changes and effusion.  Labs show mild renal insufficiency which appears to be new.  Last creatinine on record in the [facility] system was in 2013 and was normal.  On reviewing care everywhere, basic metabolic panel at [facility] on 07/12/2019, creatinine was 1.19.  Given the elevated creatinine, patient is advised not to use NSAIDs.  With multiple gout flareups over the last several months, I want to give him something to try to prevent flareups  until he can see the rheumatologist.  He is given a dose of prednisone, oxycodone-acetaminophen, colchicine and is discharged with prescriptions for prednisone, hydrocodone-acetaminophen, and colchicine.  He is encouraged to make the appointment with a rheumatologist as soon as possible.  I feel he would definitely benefit from being prescribed uric acid lowering agents, but these cannot be started during an acute flareup.  Final Clinical Impression(s) / ED Diagnoses Final diagnoses:  Knee pain    Rx / DC Orders ED Discharge Orders         Ordered    predniSONE (DELTASONE) 50 MG tablet  Daily     Discontinue  Reprint     07/25/19 0423    Colchicine (MITIGARE) 0.6 MG CAPS  Daily     Discontinue  Reprint     07/25/19 0423    HYDROcodone-acetaminophen (NORCO) 5-325 MG tablet  Every 4 hours PRN     Discontinue  Reprint     07/25/19 0425           Dione Booze, MD 07/25/19 [phone]

## 2019-10-01 ENCOUNTER — Telehealth (HOSPITAL_COMMUNITY): Payer: Self-pay

## 2019-10-01 NOTE — Telephone Encounter (Signed)
Pt insurance is active and benefits verified through [employer]. Co-pay $0.00, DED $12,500.00/$0.00 met, out of pocket $99,999,999.00 (OFP is unlimited)/$0.00 met, co-insurance 60%. No pre-authorization required. [employer]port, 10/01/19 @ 2:05PM, KMM#[id number]  Will contact patient to see if he is interested in the Cardiac Rehab Program. If interested, patient will need to complete follow up appt. Once completed, patient will be contacted for scheduling upon review by the RN Navigator.

## 2019-10-01 NOTE — Telephone Encounter (Signed)
Attempted to call patient in regards to Cardiac Rehab - LM on VM 

## 2019-10-17 ENCOUNTER — Encounter (HOSPITAL_COMMUNITY): Payer: Self-pay

## 2019-11-13 ENCOUNTER — Telehealth (HOSPITAL_COMMUNITY): Payer: Self-pay

## 2019-11-13 NOTE — Telephone Encounter (Signed)
No response from pt regarding CR.  Closed referral.  

## 2020-09-23 HISTORY — PX: OTHER SURGICAL HISTORY: SHX169

## 2021-10-28 IMAGING — CR DG KNEE 1-2V*L*
2 series · 2 of 2 positions shown · non-contrast
Comparison: None.

CLINICAL DATA: Left knee pain.

EXAM:
LEFT KNEE - 1-2 VIEW

[knee ap]
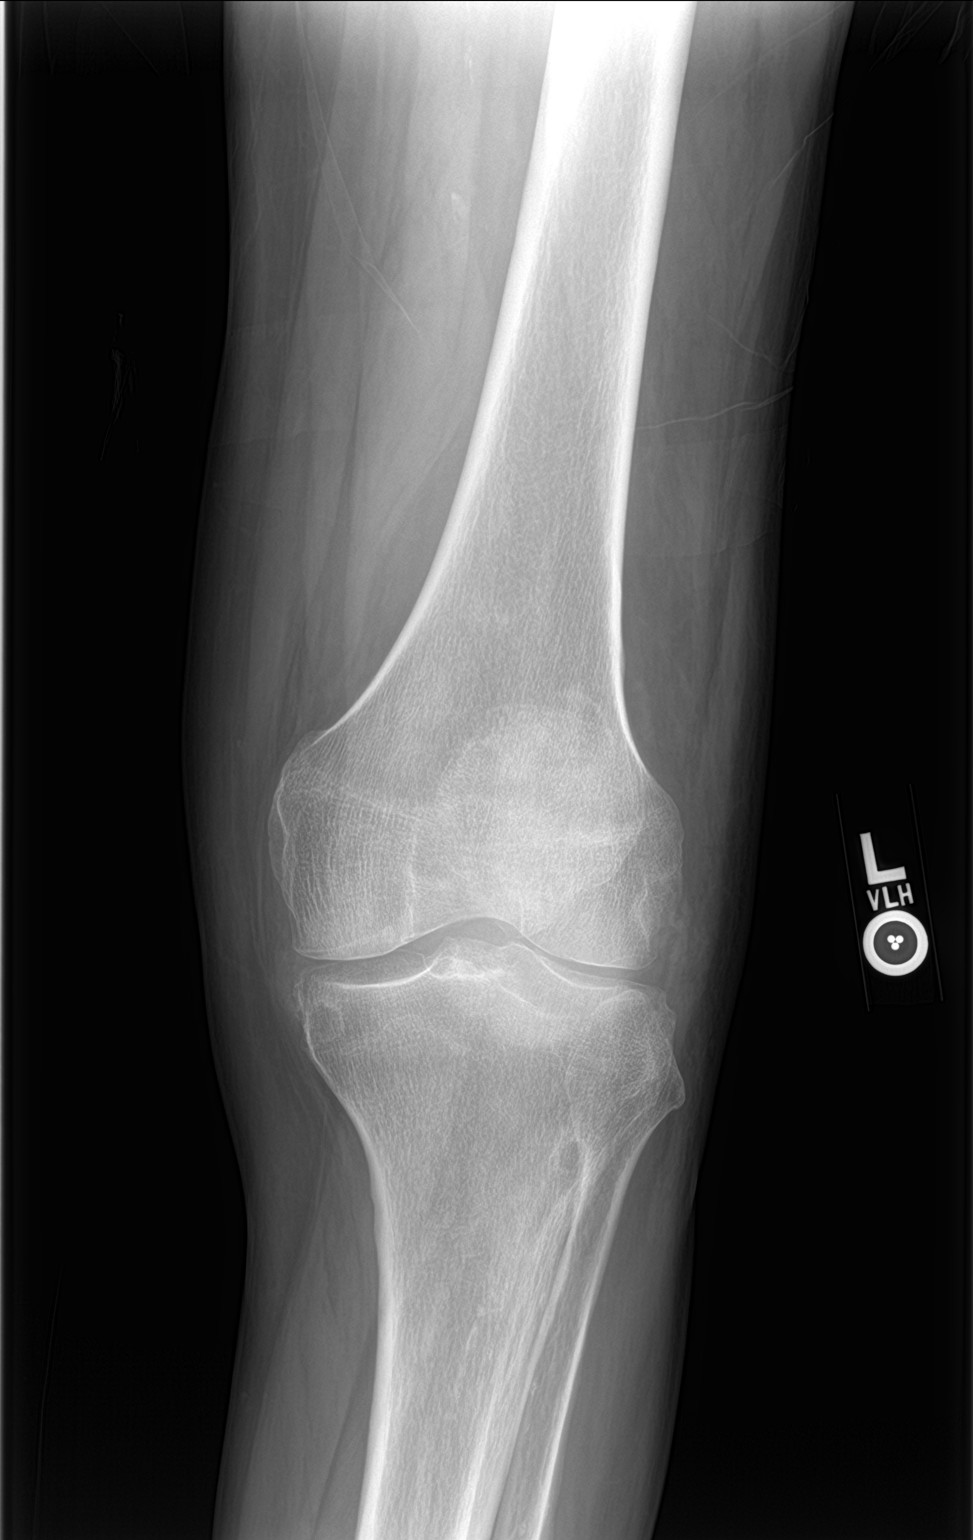

[knee lat]
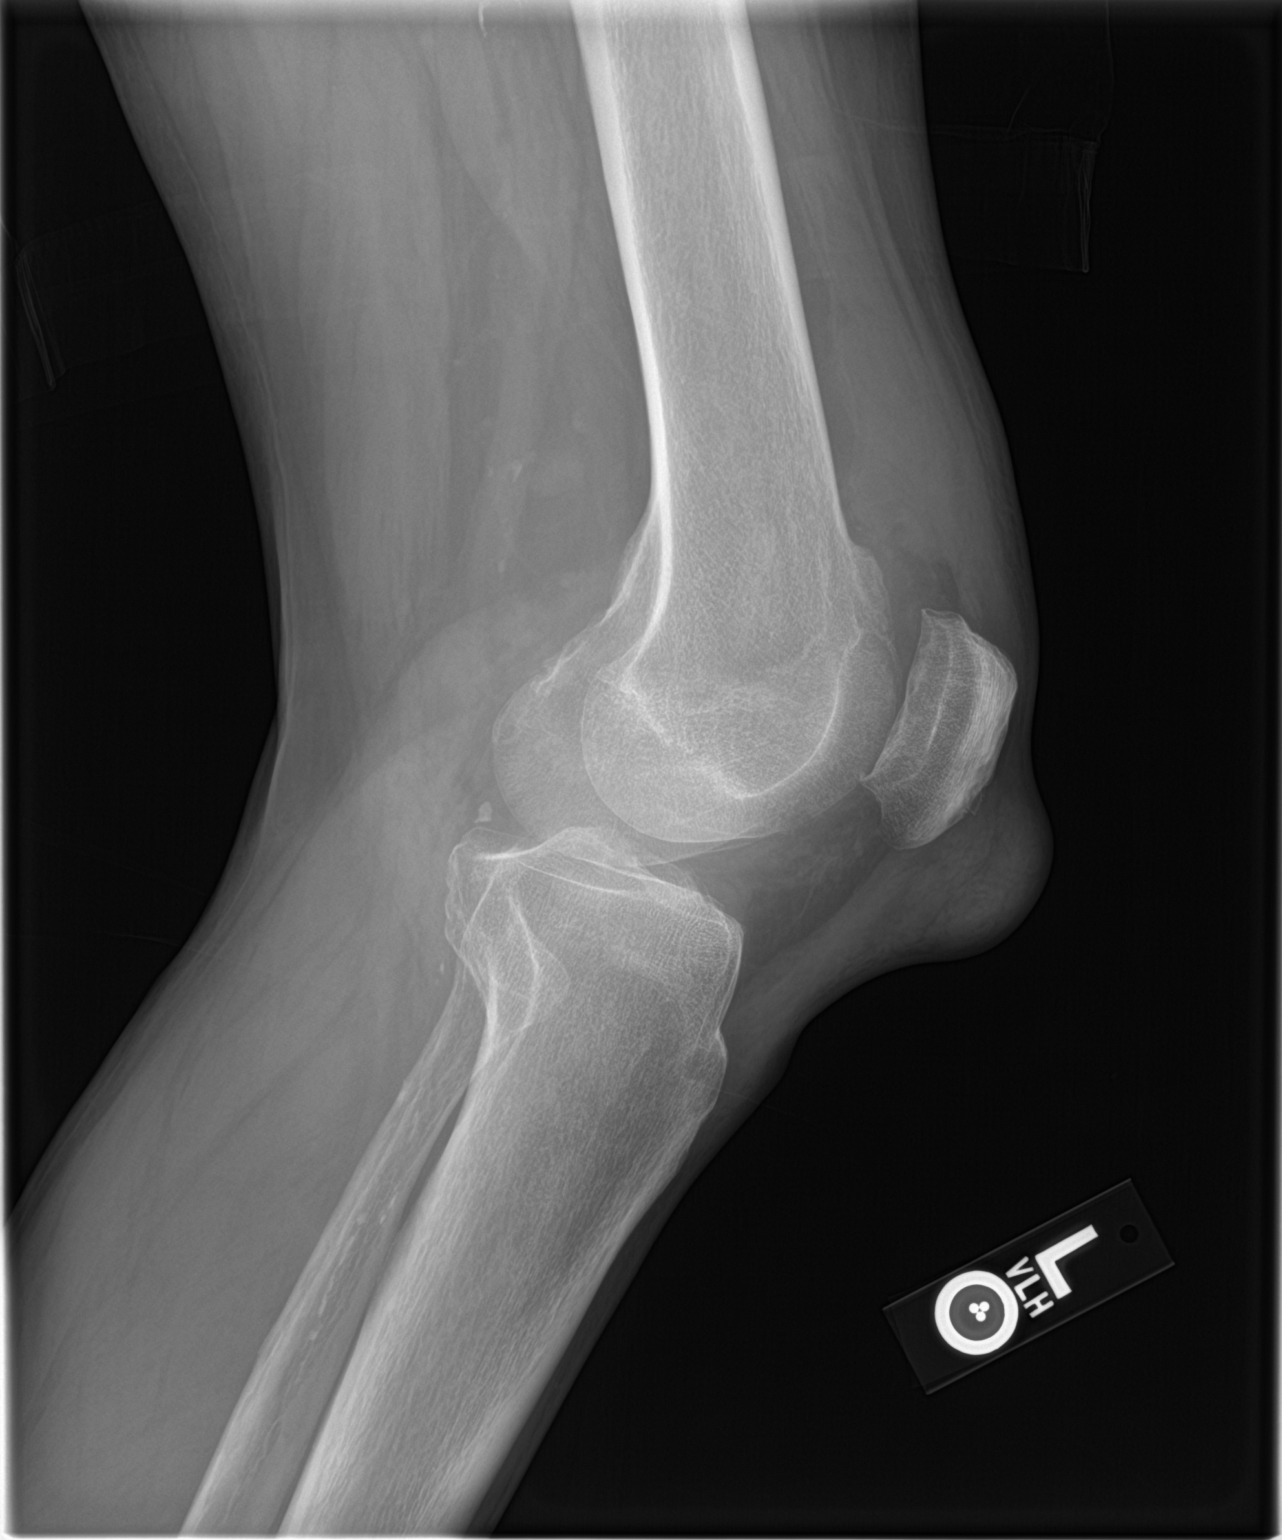

[2 of 2 positions shown; findings below may reference images not displayed]

FINDINGS: No evidence of an acute fracture or dislocation.

There is mild medial tibiofemoral compartment space narrowing. Mild
to moderate severity lateral tibiofemoral compartment space
narrowing is also seen.

A small to moderate sized joint effusion is noted.

A 3.9 cm x 2.2 cm focal area of soft tissue swelling is seen
anterior to the inferior aspect of the left patella.

A 1.7 cm x 8.9 cm curvilinear area of mildly increased opacification
is seen overlying the posterior soft tissues on the lateral view.
IMPRESSION: 1. Mild to moderate severity degenerative changes.
2. Small to moderate sized joint effusion.
3. Focal area of soft tissue swelling anterior to the inferior
aspect of the left patella.
4. Curvilinear area of mildly increased opacification overlying the
posterior soft tissues of the left knee. MRI correlation is
recommended.

## 2022-05-17 ENCOUNTER — Ambulatory Visit (HOSPITAL_COMMUNITY)
Admission: RE | Admit: 2022-05-17 | Discharge: 2022-05-17 | Disposition: A | Discharge status: Home / Self Care | Payer: Medicaid Other | Source: Ambulatory Visit | Attending: Family Medicine | Admitting: Family Medicine

## 2022-05-17 ENCOUNTER — Encounter (HOSPITAL_COMMUNITY): Payer: Self-pay

## 2022-05-17 VITALS — BP 150/90 | HR 77 | Temp 98.2°F | Resp 18

## 2022-05-17 DIAGNOSIS — M10069 Idiopathic gout, unspecified knee: Secondary | ICD-10-CM | POA: Diagnosis not present

## 2022-05-17 MED ORDER — TRAMADOL HCL 50 MG PO TABS: 50.0000 mg | ORAL_TABLET | Freq: Four times a day (QID) | ORAL | 0 refills | Status: DC | PRN

## 2022-05-17 MED ORDER — PREDNISONE 20 MG PO TABS: 40.0000 mg | ORAL_TABLET | Freq: Every day | ORAL | 0 refills | Status: AC

## 2022-05-17 MED ORDER — COLCHICINE 0.6 MG PO TABS: 0.6000 mg | ORAL_TABLET | Freq: Every day | ORAL | 0 refills | Status: DC | PRN

## 2022-05-17 MED ORDER — METHYLPREDNISOLONE ACETATE 80 MG/ML IJ SUSP
80.0000 mg | Freq: Once | INTRAMUSCULAR | Status: AC
  Administered 2022-05-17: 80 mg via INTRAMUSCULAR

## 2022-05-17 MED ORDER — METHYLPREDNISOLONE SODIUM SUCC 125 MG IJ SOLR
INTRAMUSCULAR | Status: AC
  Filled 2022-05-17: qty 2

## 2022-05-17 MED ORDER — METHYLPREDNISOLONE ACETATE 80 MG/ML IJ SUSP
INTRAMUSCULAR | Status: AC
  Filled 2022-05-17: qty 1

## 2022-05-17 NOTE — ED Triage Notes (Signed)
Pt states that he his having a gout flare in both knees and left ankle since Friday. He states he use to take colchicine but his new MD didn't refill it.

## 2022-05-17 NOTE — Discharge Instructions (Addendum)
You have been given a shot of Depo-Medrol 80 mg in the office  Take prednisone 20 mg--2 daily for 5 days  Colchicine 0.6 mg--1 tablet daily as needed for gout pain  Take tramadol 50 mg-- 1 tablet every 6 hours as needed for pain.  This medication can make you sleepy or dizzy

## 2022-05-17 NOTE — ED Provider Notes (Signed)
[facility]    CSN: [id number] Arrival date & time: 05/17/22  1342      History   Chief Complaint Chief Complaint  Patient presents with   Knee Pain    Entered by patient   Gout    HPI Jerry Young. is a 52 y.o. male.    Knee Pain  Here for pain in both knees and down his left ankle.  It began bothering him about 4 to 5 days ago.  He is having little swelling in the upper portion of his knees.  No fever or chills.  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  His last physician did not prescribe colchicine for him which is what he usually takes.  He does not have any history of diabetes.  Last EGFR in early 2023 was 60 and normal  Past Medical History:  Diagnosis Date   Gout    Hypertension     There are no problems to display for this patient.   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 Authorizing Provider  allopurinol (ZYLOPRIM) 100 MG tablet Take 100 mg by mouth daily. 09/06/21  Yes [provider]  colchicine 0.6 MG tablet Take 1 tablet (0.6 mg total) by mouth daily as needed (gout pain). 05/17/22  Yes Barrett Henle, MD  metoprolol succinate (TOPROL-XL) 100 MG 24 hr tablet Take by mouth. 05/27/21  Yes [provider]  predniSONE (DELTASONE) 20 MG tablet Take 2 tablets (40 mg total) by mouth daily with breakfast for 5 days. 05/17/22 05/22/22 Yes Brady Plant, Gwenlyn Perking, MD  traMADol (ULTRAM) 50 MG tablet Take 1 tablet (50 mg total) by mouth every 6 (six) hours as needed (pain). 05/17/22  Yes Barrett Henle, MD  acetaminophen (TYLENOL) 500 MG tablet Take 1,500-2,000 mg by mouth every 6 (six) hours as needed. For pain    [provider]  atorvastatin (LIPITOR) 80 MG tablet Take by mouth. 05/27/21   [provider]  lisinopril (PRINIVIL,ZESTRIL) 10 MG tablet  Take 1 tablet (10 mg total) by mouth daily. Patient not taking: Reported on 12/29/2017 01/19/12   Daleen Bo, MD    Family History History reviewed. No pertinent family history.  Social History Social History   Tobacco Use   Smoking status: Never   Smokeless tobacco: Current    Types: Chew  Vaping Use   Vaping Use: Never used  Substance Use Topics   Alcohol use: Yes    Comment: socially   Drug use: No     Allergies   Patient has no known allergies.   Review of Systems Review of Systems   Physical Exam Triage Vital Signs ED Triage Vitals  Enc Vitals Group     BP 05/17/22 1403 (!) 150/90     Pulse Rate 05/17/22 1403 77     Resp 05/17/22 1403 18     Temp 05/17/22 1403 98.2 F (36.8 C)     Temp Source 05/17/22 1403 Oral     SpO2 05/17/22 1403 94 %     Weight --      Height --      Head Circumference --      Peak Flow --      Pain Score 05/17/22 1400 10     Pain  Loc --      Pain Edu? --      Excl. in [place]? --    No data found.  Updated Vital Signs BP (!) 150/90 (BP Location: Right Arm)   Pulse 77   Temp 98.2 F (36.8 C) (Oral)   Resp 18   SpO2 94%   Visual Acuity Right Eye Distance:   Left Eye Distance:   Bilateral Distance:    Right Eye Near:   Left Eye Near:    Bilateral Near:     Physical Exam Vitals reviewed.  Constitutional:      General: He is not in acute distress.    Appearance: He is not ill-appearing, toxic-appearing or diaphoretic.  HENT:     Nose: Nose normal.     Mouth/Throat:     Mouth: Mucous membranes are moist.  Eyes:     Extraocular Movements: Extraocular movements intact.     Conjunctiva/sclera: Conjunctivae normal.     Pupils: Pupils are equal, round, and reactive to light.  Musculoskeletal:     Comments: There is swelling on the upper portion of both knees that is mild.  There are gouty tophi that are each about 4 cm in diameter overlying both patella.   Skin:    Coloration: Skin is not pale.  Neurological:      General: No focal deficit present.     Mental Status: He is alert and oriented to person, place, and time.  Psychiatric:        Behavior: Behavior normal.      UC Treatments / Results  Labs (all labs ordered are listed, but only abnormal results are displayed) Labs Reviewed - No data to display  EKG   Radiology No results found.  Procedures Procedures (including critical care time)  Medications Ordered in UC Medications  methylPREDNISolone acetate (DEPO-MEDROL) injection 80 mg (has no administration in time range)    Initial Impression / Assessment and Plan / UC Course  I have reviewed the triage vital signs and the nursing notes.  Pertinent labs & imaging results that were available during my care of the patient were reviewed by me and considered in my medical decision making (see chart for details).        Prednisone is prescribed as his colchicine.  He is given a shot of Depo-Medrol here in the office.  Tramadol is sent in for pain relief.  He will follow-up with his new PCP Final Clinical Impressions(s) / UC Diagnoses   Final diagnoses:  Acute idiopathic gout of knee, unspecified laterality     Discharge Instructions      You have been given a shot of Depo-Medrol 80 mg in the office  Take prednisone 20 mg--2 daily for 5 days  Colchicine 0.6 mg--1 tablet daily as needed for gout pain  Take tramadol 50 mg-- 1 tablet every 6 hours as needed for pain.  This medication can make you sleepy or dizzy      ED Prescriptions     Medication Sig Dispense Auth. Provider   predniSONE (DELTASONE) 20 MG tablet Take 2 tablets (40 mg total) by mouth daily with breakfast for 5 days. 10 tablet Barrett Henle, MD   colchicine 0.6 MG tablet Take 1 tablet (0.6 mg total) by mouth daily as needed (gout pain). 15 tablet Otelia Hettinger, Gwenlyn Perking, MD   traMADol (ULTRAM) 50 MG tablet Take 1 tablet (50 mg total) by mouth every 6 (six) hours as needed (pain). 12 tablet Juliane Poot  K, MD      I have reviewed the PDMP during this encounter.   Barrett Henle, MD 05/17/22 [phone]

## 2022-07-14 ENCOUNTER — Other Ambulatory Visit: Payer: Self-pay

## 2022-07-14 ENCOUNTER — Encounter (HOSPITAL_COMMUNITY): Payer: Self-pay

## 2022-07-14 ENCOUNTER — Ambulatory Visit (HOSPITAL_COMMUNITY)
Admission: RE | Admit: 2022-07-14 | Discharge: 2022-07-14 | Disposition: A | Discharge status: Home / Self Care | Payer: Medicaid Other | Source: Ambulatory Visit | Attending: Physician Assistant | Admitting: Physician Assistant

## 2022-07-14 VITALS — BP 120/83 | HR 97 | Temp 98.3°F | Resp 20

## 2022-07-14 DIAGNOSIS — M109 Gout, unspecified: Secondary | ICD-10-CM

## 2022-07-14 MED ORDER — METHYLPREDNISOLONE ACETATE 80 MG/ML IJ SUSP
INTRAMUSCULAR | Status: AC
  Filled 2022-07-14: qty 1

## 2022-07-14 MED ORDER — TRAMADOL HCL 50 MG PO TABS: 50.0000 mg | ORAL_TABLET | Freq: Four times a day (QID) | ORAL | 0 refills | Status: DC | PRN

## 2022-07-14 MED ORDER — COLCHICINE 0.6 MG PO TABS: 0.6000 mg | ORAL_TABLET | Freq: Every day | ORAL | 0 refills | Status: DC | PRN

## 2022-07-14 MED ORDER — PREDNISONE 50 MG PO TABS: ORAL_TABLET | ORAL | 0 refills | Status: DC

## 2022-07-14 MED ORDER — METHYLPREDNISOLONE ACETATE 80 MG/ML IJ SUSP
80.0000 mg | Freq: Once | INTRAMUSCULAR | Status: AC
  Administered 2022-07-14: 80 mg via INTRAMUSCULAR

## 2022-07-14 NOTE — ED Triage Notes (Signed)
Reports left ankle and knee is having a bout with gout.  Seen 05/17/2022 for the same.  Has not had any medications for this .  05/17/2022 had colchicine  and prednisone and symptoms improve.    Has new insurance and has not established with a primary as of yet

## 2022-07-14 NOTE — ED Provider Notes (Signed)
[facility]    CSN: [id number] Arrival date & time: 07/14/22  1427      History   Chief Complaint Chief Complaint  Patient presents with   Foot Pain   Appointment    2:30    HPI Jerry Young. is a 52 y.o. male.   Patient complains of gout in his left ankle and some discomfort in his left knee.  Patient has had a history of gout.  Patient reports he had a similar episode last month.  Patient reports he responds best to prednisone and colchicine.  Patient denies any fever or chills he has not had any injuries  The history is provided by the patient. No language interpreter was used.  Foot Pain    Past Medical History:  Diagnosis Date   Gout    Hypertension     There are no problems to display for this patient.   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 Authorizing Provider  predniSONE (DELTASONE) 50 MG tablet One tablet a day for 5 days 07/14/22  Yes Elson Areas, PA-C  traMADol (ULTRAM) 50 MG tablet Take 1 tablet (50 mg total) by mouth every 6 (six) hours as needed. 07/14/22 07/14/23 Yes Elson Areas, PA-C  acetaminophen (TYLENOL) 500 MG tablet Take 1,500-2,000 mg by mouth every 6 (six) hours as needed. For pain    [provider]  allopurinol (ZYLOPRIM) 100 MG tablet Take 100 mg by mouth daily. Patient not taking: Reported on 07/14/2022 09/06/21   [provider]  atorvastatin (LIPITOR) 80 MG tablet Take by mouth. Patient not taking: Reported on 07/14/2022 05/27/21   [provider]  colchicine 0.6 MG tablet Take 1 tablet (0.6 mg total) by mouth daily as needed (gout pain). 07/14/22   Elson Areas, PA-C  lisinopril (PRINIVIL,ZESTRIL) 10 MG tablet Take 1 tablet (10 mg total) by mouth daily. Patient not taking: Reported on 12/29/2017 01/19/12   Mancel Bale, MD  metoprolol  succinate (TOPROL-XL) 100 MG 24 hr tablet Take by mouth. 05/27/21   [provider]    Family History History reviewed. No pertinent family history.  Social History Social History   Tobacco Use   Smoking status: Never   Smokeless tobacco: Current    Types: Chew  Vaping Use   Vaping Use: Never used  Substance Use Topics   Alcohol use: Yes    Comment: socially   Drug use: No     Allergies   Patient has no known allergies.   Review of Systems Review of Systems  Musculoskeletal:  Positive for joint swelling and myalgias.  All other systems reviewed and are negative.    Physical Exam Triage Vital Signs ED Triage Vitals  Enc Vitals Group     BP 07/14/22 1443 120/83     Pulse Rate 07/14/22 1443 97     Resp 07/14/22 1443 20     Temp 07/14/22 1443 98.3 F (36.8 C)     Temp Source 07/14/22 1443 Oral     SpO2 07/14/22 1443 95 %     Weight --      Height --      Head Circumference --      Peak Flow --      Pain Score 07/14/22 1441 7     Pain Loc --  Pain Edu? --      Excl. in [place]? --    No data found.  Updated Vital Signs BP 120/83 (BP Location: Right Arm) Comment (BP Location): large cuff  Pulse 97   Temp 98.3 F (36.8 C) (Oral)   Resp 20   SpO2 95%   Visual Acuity Right Eye Distance:   Left Eye Distance:   Bilateral Distance:    Right Eye Near:   Left Eye Near:    Bilateral Near:     Physical Exam Vitals and nursing note reviewed.  Constitutional:      Appearance: He is well-developed.  HENT:     Head: Normocephalic.  Cardiovascular:     Rate and Rhythm: Normal rate.  Pulmonary:     Effort: Pulmonary effort is normal.  Abdominal:     General: There is no distension.  Musculoskeletal:        General: Swelling and tenderness present.     Cervical back: Normal range of motion.     Comments: Swollen left ankle, pain with range of motion neurovascular neurosensory intact  Skin:    General: Skin is warm.  Neurological:     General:  No focal deficit present.     Mental Status: He is alert and oriented to person, place, and time.      UC Treatments / Results  Labs (all labs ordered are listed, but only abnormal results are displayed) Labs Reviewed - No data to display  EKG   Radiology No results found.  Procedures Procedures (including critical care time)  Medications Ordered in UC Medications  methylPREDNISolone acetate (DEPO-MEDROL) injection 80 mg (has no administration in time range)    Initial Impression / Assessment and Plan / UC Course  I have reviewed the triage vital signs and the nursing notes.  Pertinent labs & imaging results that were available during my care of the patient were reviewed by me and considered in my medical decision making (see chart for details).      Final Clinical Impressions(s) / UC Diagnoses   Final diagnoses:  Acute gout of left ankle, unspecified cause   Discharge Instructions   None    ED Prescriptions     Medication Sig Dispense Auth. Provider   colchicine 0.6 MG tablet Take 1 tablet (0.6 mg total) by mouth daily as needed (gout pain). 30 tablet Mikhai Bienvenue K, [place]   traMADol (ULTRAM) 50 MG tablet Take 1 tablet (50 mg total) by mouth every 6 (six) hours as needed. 20 tablet Calyb Mcquarrie K, PA-C   predniSONE (DELTASONE) 50 MG tablet One tablet a day for 5 days 5 tablet Elson Areas, [place]      I have reviewed the PDMP during this encounter. An After Visit Summary was printed and given to the patient.    Elson Areas, [place] 07/14/22 1538

## 2022-08-02 ENCOUNTER — Ambulatory Visit (HOSPITAL_COMMUNITY): Payer: Self-pay

## 2022-09-28 ENCOUNTER — Ambulatory Visit (HOSPITAL_COMMUNITY): Payer: Self-pay

## 2022-09-29 ENCOUNTER — Encounter (HOSPITAL_COMMUNITY): Payer: Self-pay

## 2022-09-29 ENCOUNTER — Ambulatory Visit (HOSPITAL_COMMUNITY)
Admission: RE | Admit: 2022-09-29 | Discharge: 2022-09-29 | Disposition: A | Discharge status: Home / Self Care | Payer: Medicaid Other | Source: Ambulatory Visit | Attending: Nurse Practitioner | Admitting: Nurse Practitioner

## 2022-09-29 VITALS — BP 161/119 | HR 103 | Temp 100.0°F | Resp 16

## 2022-09-29 DIAGNOSIS — M109 Gout, unspecified: Secondary | ICD-10-CM

## 2022-09-29 MED ORDER — TRAMADOL HCL 50 MG PO TABS: 50.0000 mg | ORAL_TABLET | Freq: Two times a day (BID) | ORAL | 0 refills | Status: AC | PRN

## 2022-09-29 MED ORDER — COLCHICINE 0.6 MG PO TABS: 0.6000 mg | ORAL_TABLET | Freq: Every day | ORAL | 0 refills | Status: DC | PRN

## 2022-09-29 MED ORDER — PREDNISONE 20 MG PO TABS: ORAL_TABLET | ORAL | 0 refills | Status: DC

## 2022-09-29 MED ORDER — METHYLPREDNISOLONE ACETATE 40 MG/ML IJ SUSP
40.0000 mg | Freq: Once | INTRAMUSCULAR | Status: AC
  Administered 2022-09-29: 40 mg via INTRAMUSCULAR

## 2022-09-29 MED ORDER — METHYLPREDNISOLONE ACETATE 40 MG/ML IJ SUSP
INTRAMUSCULAR | Status: AC
  Filled 2022-09-29: qty 1

## 2022-09-29 NOTE — Discharge Instructions (Addendum)
We have given you a steroid shot today to help with the gout.  Please start the colchicine daily as needed for gout.  You can take the Tramadol as needed for pain as well.  Start the oral prednisone taper tomorrow.

## 2022-09-29 NOTE — ED Triage Notes (Signed)
Pt states he has gout in his right knee which is causing pain in his right hip. States he has been taking tylenol at home with no relief.

## 2022-09-29 NOTE — ED Provider Notes (Signed)
[facility]    CSN: [id number] Arrival date & time: 09/29/22  1335      History   Chief Complaint Chief Complaint  Patient presents with   Hip Pain    Gout - Entered by patient    HPI Jerry Young. is a 52 y.o. male.   Patient presents today for gout flare of his right knee which is causing pain in his right hip for the past 2 days.  Reports long history of gout, has taken allopurinol in the past but it does not help him per his report.  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  Reports he recently got insurance and is trying to get in with a PCP.  He is requesting colchicine and steroids today.  No fevers or nausea/vomiting.  Reports blood pressure at home is normally 120/80.  Denies history of chronic kidney disease.    Past Medical History:  Diagnosis Date   Gout    Hypertension     There are no problems to display for this patient.   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 Authorizing Provider  predniSONE (DELTASONE) 20 MG tablet Take 3 tablets (60mg ) on days 1-2. Take 2 tablets (40mg ) on days 3-4. Take 1 tablet (20mg ) on days 5-6, then stop. 09/29/22  Yes Valentino Nose, NP  acetaminophen (TYLENOL) 500 MG tablet Take 1,500-2,000 mg by mouth every 6 (six) hours as needed. For pain    [provider]  allopurinol (ZYLOPRIM) 100 MG tablet Take 100 mg by mouth daily. Patient not taking: Reported on 07/14/2022 09/06/21   [provider]  atorvastatin (LIPITOR) 80 MG tablet Take by mouth. Patient not taking: Reported on 07/14/2022 05/27/21   [provider]  colchicine 0.6 MG tablet Take 1 tablet (0.6 mg total) by mouth daily as needed (gout pain). 09/29/22   Valentino Nose, NP  lisinopril (PRINIVIL,ZESTRIL) 10 MG tablet Take 1 tablet (10 mg total) by mouth daily. Patient not taking:  Reported on 12/29/2017 01/19/12   Mancel Bale, MD  metoprolol succinate (TOPROL-XL) 100 MG 24 hr tablet Take by mouth. 05/27/21   [provider]  traMADol (ULTRAM) 50 MG tablet Take 1 tablet (50 mg total) by mouth every 12 (twelve) hours as needed for up to 5 days. 09/29/22 10/04/22  Valentino Nose, NP    Family History History reviewed. No pertinent family history.  Social History Social History   Tobacco Use   Smoking status: Never   Smokeless tobacco: Current    Types: Chew  Vaping Use   Vaping status: Never Used  Substance Use Topics   Alcohol use: Yes    Comment: socially   Drug use: No     Allergies   Patient has no known allergies.   Review of Systems Review of Systems Per HPI  Physical Exam Triage Vital Signs ED Triage Vitals  Encounter Vitals Group     BP 09/29/22 1358 (!) 161/119     Systolic BP Percentile --      Diastolic BP Percentile --      Pulse Rate 09/29/22 1358 (!) 103     Resp 09/29/22 1358 16     Temp 09/29/22 1358 100 F (37.8 C)     Temp Source 09/29/22 1358 Oral     SpO2  09/29/22 1358 93 %     Weight --      Height --      Head Circumference --      Peak Flow --      Pain Score 09/29/22 1400 10     Pain Loc --      Pain Education --      Exclude from Growth Chart --    No data found.  Updated Vital Signs BP (!) 161/119 (BP Location: Right Arm)   Pulse (!) 103   Temp 100 F (37.8 C) (Oral)   Resp 16   SpO2 93%   Visual Acuity Right Eye Distance:   Left Eye Distance:   Bilateral Distance:    Right Eye Near:   Left Eye Near:    Bilateral Near:     Physical Exam Vitals and nursing note reviewed.  Constitutional:      General: He is not in acute distress.    Appearance: Normal appearance. He is not toxic-appearing.  Pulmonary:     Effort: Pulmonary effort is normal. No respiratory distress.  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     Capillary Refill: Capillary refill takes less than 2 seconds.     Coloration: Skin is not jaundiced or pale.     Findings: No erythema.  Neurological:     Mental Status: He is alert and oriented to person, place, and time.  Psychiatric:        Behavior: Behavior is cooperative.      UC Treatments / Results  Labs (all labs ordered are listed, but only abnormal results are displayed) Labs Reviewed - No data to display  EKG   Radiology No results found.  Procedures Procedures (including critical care time)  Medications Ordered in UC Medications  methylPREDNISolone acetate (DEPO-MEDROL) injection 40 mg (has no administration in time range)    Initial Impression / Assessment and Plan / UC Course  I have reviewed the triage vital signs and the nursing notes.  Pertinent labs & imaging results that were available during my care of the patient were reviewed by me and considered in my medical decision making (see chart for details).   Patient is well-appearing, afebrile, not tachypneic, oxygenating well on room air.  Patient is mildly tachycardic and hypertensive in urgent care, which he attributes to pain.  1. Acute gout of right knee, unspecified cause Patient has chronic gout flares, last flare was approximately 3 months ago Treat with Depo-Medrol 40 mg IM today in urgent care, start colchicine and prednisone taper on an outpatient basis and can use tramadol as needed for pain Recommended follow-up with PCP, patient is trending of the PCP currently  The patient was given the opportunity to ask questions.  All questions answered to their satisfaction.  The patient is in agreement to this plan.    Final Clinical Impressions(s) / UC Diagnoses   Final diagnoses:  Acute gout of right knee, unspecified cause     Discharge Instructions      We have given you a steroid shot today to help with  the gout.  Please start the colchicine daily as needed for gout.  You can take the Tramadol as needed for pain as well.  Start the oral prednisone taper tomorrow.    ED Prescriptions  Medication Sig Dispense Auth. Provider   colchicine 0.6 MG tablet Take 1 tablet (0.6 mg total) by mouth daily as needed (gout pain). 15 tablet Cathlean Marseilles A, NP   traMADol (ULTRAM) 50 MG tablet Take 1 tablet (50 mg total) by mouth every 12 (twelve) hours as needed for up to 5 days. 10 tablet Cathlean Marseilles A, NP   predniSONE (DELTASONE) 20 MG tablet Take 3 tablets (60mg ) on days 1-2. Take 2 tablets (40mg ) on days 3-4. Take 1 tablet (20mg ) on days 5-6, then stop. 12 tablet Valentino Nose, NP      I have reviewed the PDMP during this encounter.   Valentino Nose, NP 09/29/22 [phone]

## 2022-12-15 ENCOUNTER — Ambulatory Visit (HOSPITAL_COMMUNITY)
Admission: RE | Admit: 2022-12-15 | Discharge: 2022-12-15 | Disposition: A | Discharge status: Home / Self Care | Payer: Medicaid Other | Source: Ambulatory Visit | Attending: Family Medicine | Admitting: Family Medicine

## 2022-12-15 ENCOUNTER — Encounter (HOSPITAL_COMMUNITY): Payer: Self-pay

## 2022-12-15 VITALS — BP 118/83 | HR 93 | Temp 98.2°F | Resp 18 | Ht 71.0 in | Wt 210.0 lb

## 2022-12-15 DIAGNOSIS — M109 Gout, unspecified: Secondary | ICD-10-CM | POA: Diagnosis not present

## 2022-12-15 MED ORDER — METHYLPREDNISOLONE ACETATE 80 MG/ML IJ SUSP
80.0000 mg | Freq: Once | INTRAMUSCULAR | Status: AC
  Administered 2022-12-15: 80 mg via INTRAMUSCULAR

## 2022-12-15 MED ORDER — COLCHICINE 0.6 MG PO TABS: 0.6000 mg | ORAL_TABLET | Freq: Every day | ORAL | 0 refills | Status: DC | PRN

## 2022-12-15 MED ORDER — METHYLPREDNISOLONE ACETATE 80 MG/ML IJ SUSP
INTRAMUSCULAR | Status: AC
  Filled 2022-12-15: qty 1

## 2022-12-15 MED ORDER — PREDNISONE 20 MG PO TABS: 40.0000 mg | ORAL_TABLET | Freq: Every day | ORAL | 0 refills | Status: AC

## 2022-12-15 NOTE — Discharge Instructions (Signed)
You have been given an injection of methylprednisolone 80 mg, steroid.  Take prednisone 20 mg--2 daily for 5 days  Colchicine 0.6 mg--1 tablet daily as needed for gout pain   You can use the QR code/website at the back of the summary paperwork to schedule yourself a new patient appointment with primary care

## 2022-12-15 NOTE — ED Provider Notes (Signed)
[facility]    CSN: [id number] Arrival date & time: 12/15/22  1346      History   Chief Complaint Chief Complaint  Patient presents with   Knee Pain    Gout pain in both knees - Entered by patient    HPI Jerry Young. is a 52 y.o. male.    Knee Pain Here with a 5-day history of bilateral knee pain and swelling.  This is typical of his gout.  He maybe had some subjective fever noted to but that is resolved.  He is not allergic to medications  No diabetes and no chronic kidney disease.  He is still working on getting in with the primary care    Past Medical History:  Diagnosis Date   Gout    Hypertension     There are no problems to display for this patient.   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 Authorizing Provider  acetaminophen (TYLENOL) 500 MG tablet Take 1,500-2,000 mg by mouth every 6 (six) hours as needed. For pain   Yes [provider]  colchicine 0.6 MG tablet Take 1 tablet (0.6 mg total) by mouth daily as needed (gout pain). 12/15/22  Yes Zenia Resides, MD  predniSONE (DELTASONE) 20 MG tablet Take 2 tablets (40 mg total) by mouth daily with breakfast for 5 days. 12/15/22 12/20/22 Yes Zenia Resides, MD  allopurinol (ZYLOPRIM) 100 MG tablet Take 100 mg by mouth daily. Patient not taking: Reported on 07/14/2022 09/06/21   [provider]  atorvastatin (LIPITOR) 80 MG tablet Take by mouth. Patient not taking: Reported on 07/14/2022 05/27/21   [provider]  lisinopril (PRINIVIL,ZESTRIL) 10 MG tablet Take 1 tablet (10 mg total) by mouth daily. Patient not taking: Reported on 12/29/2017 01/19/12   Mancel Bale, MD  metoprolol succinate (TOPROL-XL) 100 MG 24 hr tablet Take by mouth. 05/27/21   [provider]    Family History History reviewed. No  pertinent family history.  Social History Social History   Tobacco Use   Smoking status: Never   Smokeless tobacco: Current    Types: Chew  Vaping Use   Vaping status: Never Used  Substance Use Topics   Alcohol use: Yes    Comment: socially   Drug use: No     Allergies   Patient has no known allergies.   Review of Systems Review of Systems   Physical Exam Triage Vital Signs ED Triage Vitals  Encounter Vitals Group     BP 12/15/22 1422 118/83     Systolic BP Percentile --      Diastolic BP Percentile --      Pulse Rate 12/15/22 1422 93     Resp 12/15/22 1422 18     Temp 12/15/22 1422 98.2 F (36.8 C)     Temp Source 12/15/22 1422 Oral     SpO2 12/15/22 1422 94 %     Weight 12/15/22 1424 210 lb (95.3 kg)     Height 12/15/22 1424 5\' 11"  (1.803 m)     Head Circumference --      Peak Flow --      Pain Score 12/15/22 1424 8     Pain Loc --      Pain Education --      Exclude from Growth Chart --  No data found.  Updated Vital Signs BP 118/83 (BP Location: Right Arm)   Pulse 93   Temp 98.2 F (36.8 C) (Oral)   Resp 18   Ht 5\' 11"  (1.803 m)   Wt 95.3 kg   SpO2 94%   BMI 29.29 kg/m   Visual Acuity Right Eye Distance:   Left Eye Distance:   Bilateral Distance:    Right Eye Near:   Left Eye Near:    Bilateral Near:     Physical Exam Vitals reviewed.  Constitutional:      General: He is not in acute distress.    Appearance: He is not ill-appearing, toxic-appearing or diaphoretic.  Cardiovascular:     Rate and Rhythm: Normal rate and regular rhythm.  Musculoskeletal:     Comments: There is tenderness and mild edema of both knees.  There is tophi on the right anterior knee.    Skin:    Coloration: Skin is not pale.  Neurological:     General: No focal deficit present.     Mental Status: He is alert and oriented to person, place, and time.  Psychiatric:        Behavior: Behavior normal.      UC Treatments / Results  Labs (all labs  ordered are listed, but only abnormal results are displayed) Labs Reviewed - No data to display  EKG   Radiology No results found.  Procedures Procedures (including critical care time)  Medications Ordered in UC Medications  methylPREDNISolone acetate (DEPO-MEDROL) injection 80 mg (has no administration in time range)    Initial Impression / Assessment and Plan / UC Course  I have reviewed the triage vital signs and the nursing notes.  Pertinent labs & imaging results that were available during my care of the patient were reviewed by me and considered in my medical decision making (see chart for details).   Depo-Medrol injections given here in 5 days burst of prednisone sent into the pharmacy.  Colchicine is also sent in  He is given instructions on how to self schedule for primary care Final Clinical Impressions(s) / UC Diagnoses   Final diagnoses:  Exacerbation of gout     Discharge Instructions      You have been given an injection of methylprednisolone 80 mg, steroid.  Take prednisone 20 mg--2 daily for 5 days  Colchicine 0.6 mg--1 tablet daily as needed for gout pain   You can use the QR code/website at the back of the summary paperwork to schedule yourself a new patient appointment with primary care      ED Prescriptions     Medication Sig Dispense Auth. Provider   predniSONE (DELTASONE) 20 MG tablet Take 2 tablets (40 mg total) by mouth daily with breakfast for 5 days. 10 tablet Zenia Resides, MD   colchicine 0.6 MG tablet Take 1 tablet (0.6 mg total) by mouth daily as needed (gout pain). 15 tablet Lucielle Vokes, Janace Aris, MD      PDMP not reviewed this encounter.   Zenia Resides, MD 12/15/22 [phone]

## 2022-12-15 NOTE — ED Triage Notes (Signed)
Patient c/o gout flare in both knees x 5 days.  Patient states that he is unable to walk, cause both feet to swell.  Patient has taken Tylenol for pain.  No apparent injury.

## 2023-02-10 ENCOUNTER — Ambulatory Visit: Payer: Medicaid Other | Admitting: Nurse Practitioner

## 2023-02-28 ENCOUNTER — Ambulatory Visit: Payer: Medicaid Other | Admitting: Family Medicine

## 2023-02-28 ENCOUNTER — Encounter: Payer: Self-pay | Admitting: Family Medicine

## 2023-02-28 VITALS — BP 130/90 | HR 99 | Temp 99.1°F | Ht 71.0 in

## 2023-02-28 DIAGNOSIS — M1A09X0 Idiopathic chronic gout, multiple sites, without tophus (tophi): Secondary | ICD-10-CM | POA: Diagnosis not present

## 2023-02-28 DIAGNOSIS — I10 Essential (primary) hypertension: Secondary | ICD-10-CM | POA: Insufficient documentation

## 2023-02-28 DIAGNOSIS — M25561 Pain in right knee: Secondary | ICD-10-CM

## 2023-02-28 DIAGNOSIS — G8929 Other chronic pain: Secondary | ICD-10-CM

## 2023-02-28 DIAGNOSIS — E782 Mixed hyperlipidemia: Secondary | ICD-10-CM

## 2023-02-28 DIAGNOSIS — R768 Other specified abnormal immunological findings in serum: Secondary | ICD-10-CM

## 2023-02-28 DIAGNOSIS — M2550 Pain in unspecified joint: Secondary | ICD-10-CM

## 2023-02-28 DIAGNOSIS — I25810 Atherosclerosis of coronary artery bypass graft(s) without angina pectoris: Secondary | ICD-10-CM | POA: Diagnosis not present

## 2023-02-28 DIAGNOSIS — I2510 Atherosclerotic heart disease of native coronary artery without angina pectoris: Secondary | ICD-10-CM | POA: Insufficient documentation

## 2023-02-28 DIAGNOSIS — R7982 Elevated C-reactive protein (CRP): Secondary | ICD-10-CM

## 2023-02-28 DIAGNOSIS — R700 Elevated erythrocyte sedimentation rate: Secondary | ICD-10-CM

## 2023-02-28 DIAGNOSIS — M0580 Other rheumatoid arthritis with rheumatoid factor of unspecified site: Secondary | ICD-10-CM

## 2023-02-28 DIAGNOSIS — F330 Major depressive disorder, recurrent, mild: Secondary | ICD-10-CM

## 2023-02-28 LAB — CBC WITH DIFFERENTIAL/PLATELET
Basophils Absolute: 0.1 10*3/uL (ref 0.0–0.1)
Basophils Relative: 0.9 % (ref 0.0–3.0)
Eosinophils Absolute: 0.1 10*3/uL (ref 0.0–0.7)
Eosinophils Relative: 0.7 % (ref 0.0–5.0)
HCT: 50.8 % (ref 39.0–52.0)
Hemoglobin: 16.9 g/dL (ref 13.0–17.0)
Lymphocytes Relative: 15.8 % (ref 12.0–46.0)
Lymphs Abs: 1.7 10*3/uL (ref 0.7–4.0)
MCHC: 33.4 g/dL (ref 30.0–36.0)
MCV: 85.6 fL (ref 78.0–100.0)
Monocytes Absolute: 1 10*3/uL (ref 0.1–1.0)
Monocytes Relative: 9.7 % (ref 3.0–12.0)
Neutro Abs: 7.9 10*3/uL — ABNORMAL HIGH (ref 1.4–7.7)
Neutrophils Relative %: 72.9 % (ref 43.0–77.0)
Platelets: 306 10*3/uL (ref 150.0–400.0)
RBC: 5.93 Mil/uL — ABNORMAL HIGH (ref 4.22–5.81)
RDW: 15 % (ref 11.5–15.5)
WBC: 10.8 10*3/uL — ABNORMAL HIGH (ref 4.0–10.5)

## 2023-02-28 LAB — LIPID PANEL
Cholesterol: 212 mg/dL — ABNORMAL HIGH (ref 0–200)
HDL: 53.8 mg/dL (ref >39.00)
LDL Cholesterol: 131 mg/dL — ABNORMAL HIGH (ref 0–99)
NonHDL: 157.75
Total CHOL/HDL Ratio: 4
Triglycerides: 135 mg/dL (ref 0.0–149.0)
VLDL: 27 mg/dL (ref 0.0–40.0)

## 2023-02-28 LAB — COMPREHENSIVE METABOLIC PANEL
ALT: 12 U/L (ref 0–53)
AST: 16 U/L (ref 0–37)
Albumin: 4.2 g/dL (ref 3.5–5.2)
Alkaline Phosphatase: 122 U/L — ABNORMAL HIGH (ref 39–117)
BUN: 19 mg/dL (ref 6–23)
CO2: 26 meq/L (ref 19–32)
Calcium: 9.9 mg/dL (ref 8.4–10.5)
Chloride: 96 meq/L (ref 96–112)
Creatinine, Ser: 1.36 mg/dL (ref 0.40–1.50)
GFR: 58.82 mL/min — ABNORMAL LOW (ref >60.00)
Glucose, Bld: 112 mg/dL — ABNORMAL HIGH (ref 70–99)
Potassium: 4.1 meq/L (ref 3.5–5.1)
Sodium: 134 meq/L — ABNORMAL LOW (ref 135–145)
Total Bilirubin: 1.1 mg/dL (ref 0.2–1.2)
Total Protein: 8.3 g/dL (ref 6.0–8.3)

## 2023-02-28 LAB — URIC ACID: Uric Acid, Serum: 10.4 mg/dL — ABNORMAL HIGH (ref 4.0–7.8)

## 2023-02-28 LAB — C-REACTIVE PROTEIN: CRP: 24 mg/dL — ABNORMAL HIGH (ref 0.5–20.0)

## 2023-02-28 LAB — SEDIMENTATION RATE: Sed Rate: 104 mm/h — ABNORMAL HIGH (ref 0–20)

## 2023-02-28 MED ORDER — TRAMADOL HCL 50 MG PO TABS
50.0000 mg | ORAL_TABLET | Freq: Four times a day (QID) | ORAL | 0 refills | Status: AC | PRN
Start: 2023-02-28 — End: 2023-03-05

## 2023-02-28 MED ORDER — COLCHICINE 0.6 MG PO TABS
0.6000 mg | ORAL_TABLET | Freq: Every day | ORAL | 1 refills | Status: DC
Start: 2023-02-28 — End: 2023-03-24

## 2023-02-28 MED ORDER — PREDNISONE 10 MG (21) PO TBPK
ORAL_TABLET | Freq: Every day | ORAL | 0 refills | Status: AC
Start: 2023-02-28 — End: 2023-03-06

## 2023-02-28 MED ORDER — METOPROLOL SUCCINATE ER 25 MG PO TB24
25.0000 mg | ORAL_TABLET | Freq: Every day | ORAL | 3 refills | Status: DC
Start: 2023-02-28 — End: 2023-05-26

## 2023-02-28 NOTE — Patient Instructions (Signed)
 Welcome to [facility]!  We have placed a referral for you today. Someone will be reaching out to get you scheduled.  Check BP at home once daily in the mornings for the next month. Follow up with me in a month to recheck blood pressures.

## 2023-02-28 NOTE — Progress Notes (Signed)
 New Patient Office Visit  Subjective    Patient ID: Jerry Strick., male    DOB: 07/04/1970  Age: 53 y.o. MRN: [id number]  CC:  Chief Complaint  Patient presents with   Establish Care    Establish Care. Referral to cardiology and urology. Recent gout spell in patient's right knee (notes of throbbing pain from knee down to leg). Patient notes of past history of gout that was usually present within the big toe. Also experiencing some shortness of breath with exertion. Issues with mobility within the last year, having issues with bearing weight on right leg.     HPI Jerry Young. presents to establish care today. Triple bypass in 2021 with Dr Arne at [facility]. He is fasting today. Reports worsening gout to bilateral knees and ankles. Has never seen rheumatology or anyone else for pain or inflammation management. Reports that he adheres to low purine diet. Reports that he has been out of medications for weeks, states he has not had insurance and does have it now. Reports mild shortness of breath with exertion, that improves with rest. Reports depression related to chronic pain. Denies SI, HI. Denies other concerns today    Outpatient Encounter Medications as of 02/28/2023  Medication Sig   acetaminophen  (TYLENOL ) 500 MG tablet Take 1,500-2,000 mg by mouth every 6 (six) hours as needed. For pain   colchicine  0.6 MG tablet Take 1 tablet (0.6 mg total) by mouth daily.   metoprolol  succinate (TOPROL -XL) 25 MG 24 hr tablet Take 1 tablet (25 mg total) by mouth daily.   predniSONE  (STERAPRED UNI-PAK 21 TAB) 10 MG (21) TBPK tablet Take by mouth daily for 6 days. Take 6 tablets on day 1, 5 tablets on day 2, 4 tablets on day 3, 3 tablets on day 4, 2 tablets on day 5, 1 tablet on day 6   traMADol  (ULTRAM ) 50 MG tablet Take 1 tablet (50 mg total) by mouth every 6 (six) hours as needed for up to 5 days.   [DISCONTINUED] colchicine  0.6 MG tablet Take 1 tablet (0.6 mg total) by mouth daily as  needed (gout pain).   [DISCONTINUED] metoprolol  succinate (TOPROL -XL) 100 MG 24 hr tablet Take by mouth.   [DISCONTINUED] allopurinol  (ZYLOPRIM ) 100 MG tablet Take 100 mg by mouth daily. (Patient not taking: Reported on 02/28/2023)   [DISCONTINUED] atorvastatin  (LIPITOR) 80 MG tablet Take by mouth. (Patient not taking: Reported on 02/28/2023)   [DISCONTINUED] lisinopril  (PRINIVIL ,ZESTRIL ) 10 MG tablet Take 1 tablet (10 mg total) by mouth daily. (Patient not taking: Reported on 02/28/2023)   No facility-administered encounter medications on file as of 02/28/2023.    Past Medical History:  Diagnosis Date   Gout    Hypertension    Myocardial infarction (HCC) 09/24/19   Had triple bypass surgery    Past Surgical History:  Procedure Laterality Date   CORONARY ARTERY BYPASS GRAFT  09/24/19   Triple bypass   triple bypass  09/23/2020    Family History  Problem Relation Age of Onset   Arthritis Father    Heart disease Father    Hypertension Father    Kidney disease Father    Arthritis Maternal Grandfather    Cancer Maternal Grandfather    Heart disease Maternal Grandfather    Hypertension Maternal Grandfather    Arthritis Maternal Grandmother    Arthritis Paternal Grandfather    Heart disease Paternal Grandfather    Hypertension Paternal Grandfather    Arthritis Paternal Grandmother    Arthritis  Paternal Aunt    Heart disease Paternal Aunt    Hypertension Paternal Aunt    Arthritis Paternal Uncle    Heart disease Paternal Uncle    Hypertension Paternal Uncle     Social History   Socioeconomic History   Marital status: Divorced    Spouse name: Not on file   Number of children: Not on file   Years of education: Not on file   Highest education level: Some college, no degree  Occupational History   Not on file  Tobacco Use   Smoking status: Never   Smokeless tobacco: Current    Types: Chew   Tobacco comments:    I use chewing tobacco  Vaping Use   Vaping status: Never  Used  Substance and Sexual Activity   Alcohol use: Yes    Comment: Varies but not often   Drug use: No   Sexual activity: Not Currently    Birth control/protection: None  Other Topics Concern   Not on file  Social History Narrative   Not on file   Social Drivers of Health   Financial Resource Strain: High Risk (02/04/2023)   Overall Financial Resource Strain (CARDIA)    Difficulty of Paying Living Expenses: Hard  Food Insecurity: Food Insecurity Present (02/04/2023)   Hunger Vital Sign    Worried About Running Out of Food in the Last Year: Sometimes true    Ran Out of Food in the Last Year: Never true  Transportation Needs: No Transportation Needs (02/04/2023)   PRAPARE - Administrator, Civil Service (Medical): No    Lack of Transportation (Non-Medical): No  Physical Activity: Insufficiently Active (02/04/2023)   Exercise Vital Sign    Days of Exercise per Week: 1 day    Minutes of Exercise per Session: 20 min  Stress: Stress Concern Present (02/04/2023)   [employer] of Occupational Health - Occupational Stress Questionnaire    Feeling of Stress : Rather much  Social Connections: Socially Isolated (02/04/2023)   Social Connection and Isolation Panel [NHANES]    Frequency of Communication with Friends and Family: Three times a week    Frequency of Social Gatherings with Friends and Family: Once a week    Attends Religious Services: Never    Database Administrator or Organizations: No    Attends Engineer, Structural: Not on file    Marital Status: Divorced  Intimate Partner Violence: Unknown (05/21/2021)   Received from [employer], [facility]   HITS    Physically Hurt: Not on file    Insult or Talk Down To: Not on file    Threaten Physical Harm: Not on file    Scream or Curse: Not on file    ROS Per HPI      Objective    BP (!) 130/90   Pulse 99   Temp 99.1 F (37.3 C)   Ht 5' 11 (1.803 m)   SpO2 99%   BMI 29.29 kg/m    Physical Exam Vitals and nursing note reviewed.  Constitutional:      General: He is not in acute distress.    Appearance: Normal appearance.  HENT:     Head: Normocephalic and atraumatic.  Eyes:     Extraocular Movements: Extraocular movements intact.  Cardiovascular:     Rate and Rhythm: Normal rate and regular rhythm.     Pulses: Normal pulses.     Heart sounds: Murmur heard.  Pulmonary:     Effort: Pulmonary  effort is normal. No respiratory distress.     Breath sounds: Normal breath sounds. No wheezing, rhonchi or rales.  Musculoskeletal:        General: Swelling and tenderness present.     Cervical back: Normal range of motion.     Comments: Multiple gouty tophi to bilateral knees, multiple fingers. LROM to most joints r/t pain, swelling. In w/c for visit  Lymphadenopathy:     Cervical: No cervical adenopathy.  Skin:    General: Skin is warm and dry.     Findings: Erythema present.     Comments: Mild erythema at ankles, anterior R knee erythematous with gouty tophi, tender  Neurological:     General: No focal deficit present.     Mental Status: He is alert and oriented to person, place, and time.  Psychiatric:        Mood and Affect: Mood normal.        Behavior: Behavior normal.        Assessment & Plan:   Primary hypertension -     CBC with Differential/Platelet -     Comprehensive metabolic panel -     Ambulatory referral to Cardiology -     Metoprolol  Succinate ER; Take 1 tablet (25 mg total) by mouth daily.  Dispense: 90 tablet; Refill: 3  Mixed hyperlipidemia -     Lipid panel -     Ambulatory referral to Cardiology  Chronic gout of multiple sites, unspecified cause -     Uric acid -     C-reactive protein -     Sedimentation rate -     ANA -     Rheumatoid factor -     predniSONE ; Take by mouth daily for 6 days. Take 6 tablets on day 1, 5 tablets on day 2, 4 tablets on day 3, 3 tablets on day 4, 2 tablets on day 5, 1 tablet on day 6  Dispense: 21  tablet; Refill: 0 -     Colchicine ; Take 1 tablet (0.6 mg total) by mouth daily.  Dispense: 10 tablet; Refill: 1 -     traMADol  HCl; Take 1 tablet (50 mg total) by mouth every 6 (six) hours as needed for up to 5 days.  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   Corean Ku, FNP

## 2023-03-01 ENCOUNTER — Encounter: Payer: Self-pay | Admitting: Family Medicine

## 2023-03-02 LAB — ANTI-NUCLEAR AB-TITER (ANA TITER): ANA Titer 1: 1:40 {titer} — ABNORMAL HIGH

## 2023-03-02 LAB — RHEUMATOID FACTOR: Rheumatoid fact SerPl-aCnc: 26 [IU]/mL — ABNORMAL HIGH (ref <14)

## 2023-03-02 LAB — ANA: Anti Nuclear Antibody (ANA): POSITIVE — AB

## 2023-03-03 NOTE — Addendum Note (Signed)
Addended by: Sherald Barge on: 03/03/2023 11:51 AM   Modules accepted: Orders

## 2023-03-07 ENCOUNTER — Ambulatory Visit: Payer: Medicaid Other | Admitting: Family Medicine

## 2023-03-10 NOTE — Progress Notes (Signed)
 Office Visit Note  Patient: Jerry Young.             Date of Birth: December 25, 1970           MRN: [id number]             PCP: Alvia Krabbe, FNP Referring: Alvia Krabbe, FNP Visit Date: 03/24/2023 Occupation: @GUAROCC @  Subjective:  History of gout   History of Present Illness: Jerry Wale. is a 53 y.o. male who presents today for a new patient consultation.  Patient reports that he was diagnosed with gout at the age of 73.  His initial Presentation was involving his foot but since then he has had gout flares recur involving multiple joints.  His flares are typically in either his elbows, hands, or knee joints.  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  Patient states for the past year he has been experiencing recurrent flares on a monthly basis.  Patient states his flares are typically responsive to prednisone  use.  Patient states that in the past he has tried colchicine  off-and-on but discontinued about 1 and half years ago.  Patient states that in the past he is taking allopurinol  consistently for a few months at a time but did not ever notice improvement in the frequency of flares.  The highest dose of allopurinol  the patient has taken in the past was 400 mg daily. Patient reports that his paternal grandmother had gout.  He has also had other family members with arthritis but is unsure if they were diagnosed with rheumatoid arthritis.    Component     Latest Ref Rng 02/28/2023  ANA Titer 1     titer 1:40 (H)   ANA Pattern 1 Nuclear, Homogeneous !   Uric Acid, Serum     4.0 - 7.8 mg/dL 89.5 (H)   CRP     0.5 - 20.0 mg/dL 75.9 (H)   Sed Rate     0 - 20 mm/hr 104 (H)   Anti Nuclear Antibody (ANA)     NEGATIVE  POSITIVE !   RA Latex Turbid.     <14 IU/mL 26 (H)     Legend: (H) High ! Abnormal  Activities of Daily Living:  Patient reports morning stiffness for 3 minutes.   Patient Reports nocturnal pain.  Difficulty dressing/grooming:  Denies Difficulty climbing stairs: Reports Difficulty getting out of chair: Reports Difficulty using hands for taps, buttons, cutlery, and/or writing: Reports  Review of Systems  Constitutional:  Positive for fatigue.  HENT:  Negative for mouth sores and mouth dryness.   Eyes:  Negative for dryness.  Respiratory:  Positive for shortness of breath.   Cardiovascular:  Positive for palpitations. Negative for chest pain.  Gastrointestinal:  Negative for blood in stool, constipation and diarrhea.  Endocrine: Negative for increased urination.  Genitourinary:  Negative for involuntary urination.  Musculoskeletal:  Positive for joint pain, gait problem, joint pain, joint swelling, muscle weakness and morning stiffness. Negative for myalgias, muscle tenderness and myalgias.  Skin:  Positive for hair loss. Negative for color change, rash and sensitivity to sunlight.  Allergic/Immunologic: Negative for susceptible to infections.  Neurological:  Positive for dizziness. Negative for headaches.  Hematological:  Negative for swollen glands.  Psychiatric/Behavioral:  Positive for depressed mood and sleep disturbance. The patient is not nervous/anxious.     PMFS History:  Patient Active Problem List   Diagnosis Date Noted   Primary hypertension 02/28/2023   Mixed hyperlipidemia 02/28/2023  Chronic gout of multiple sites 02/28/2023   Coronary artery disease 02/28/2023    Past Medical History:  Diagnosis Date   Gout    Hypertension    Myocardial infarction (HCC) 09/24/19   Had triple bypass surgery    Family History  Problem Relation Age of Onset   Arthritis Mother    Arthritis Father    Heart disease Father    Hypertension Father    Kidney disease Father    Arthritis Paternal Aunt    Heart disease Paternal Aunt    Hypertension Paternal Aunt    Arthritis Paternal Uncle    Heart disease Paternal Uncle    Hypertension Paternal Uncle    Arthritis Maternal Grandmother    Arthritis  Maternal Grandfather    Cancer Maternal Grandfather    Heart disease Maternal Grandfather    Hypertension Maternal Grandfather    Arthritis Paternal Grandmother    Arthritis Paternal Grandfather    Heart disease Paternal Grandfather    Hypertension Paternal Grandfather    Past Surgical History:  Procedure Laterality Date   CORONARY ARTERY BYPASS GRAFT  09/24/19   Triple bypass   triple bypass  09/23/2020   Social History   Social History Narrative   Not on file    There is no immunization history on file for this patient.   Objective: Vital Signs: BP 114/80 (BP Location: Right Arm, Patient Position: Sitting, Cuff Size: Normal)   Pulse 80   Resp 17   Ht 5' 10.5 (1.791 m)   Wt 229 lb (103.9 kg)   BMI 32.39 kg/m    Physical Exam Vitals and nursing note reviewed.  Constitutional:      Appearance: He is well-developed.  HENT:     Head: Normocephalic and atraumatic.  Eyes:     Conjunctiva/sclera: Conjunctivae normal.     Pupils: Pupils are equal, round, and reactive to light.  Cardiovascular:     Rate and Rhythm: Normal rate and regular rhythm.     Heart sounds: Normal heart sounds.  Pulmonary:     Effort: Pulmonary effort is normal.     Breath sounds: Normal breath sounds.  Abdominal:     General: Bowel sounds are normal.     Palpations: Abdomen is soft.  Musculoskeletal:     Cervical back: Normal range of motion and neck supple.  Skin:    General: Skin is warm and dry.     Capillary Refill: Capillary refill takes less than 2 seconds.  Neurological:     Mental Status: He is alert and oriented to person, place, and time.  Psychiatric:        Behavior: Behavior normal.             Musculoskeletal Exam: C-spine has good ROM. Stiffness with ROM of both shoulders.  Limited extension of both elbows.  Limited ROM of both wrists.  DIP prominence noted. No synovitis of MCP joints.  Hip joints have slightly limited ROM. Limited extension of both knees. Tophi on  the anterior aspect of both knees.  Mild edema noted in both ankles.   CDAI Exam: CDAI Score: -- Patient Global: --; Provider Global: -- Swollen: --; Tender: -- Joint Exam 03/24/2023   No joint exam has been documented for this visit   There is currently no information documented on the homunculus. Go to the Rheumatology activity and complete the homunculus joint exam.  Investigation: No additional findings.  Imaging: No results found.  Recent Labs: Lab Results  Component Value  Date   WBC 10.8 (H) 02/28/2023   HGB 16.9 02/28/2023   PLT 306.0 02/28/2023   NA 134 (L) 02/28/2023   K 4.1 02/28/2023   CL 96 02/28/2023   CO2 26 02/28/2023   GLUCOSE 112 (H) 02/28/2023   BUN 19 02/28/2023   CREATININE 1.36 02/28/2023   BILITOT 1.1 02/28/2023   ALKPHOS 122 (H) 02/28/2023   AST 16 02/28/2023   ALT 12 02/28/2023   PROT 8.3 02/28/2023   ALBUMIN 4.2 02/28/2023   CALCIUM  9.9 02/28/2023   GFRAA 54 (L) 07/24/2019    Speciality Comments: No specialty comments available.  Procedures:  No procedures performed Allergies: Patient has no known allergies.   Assessment / Plan:     Visit Diagnoses: Idiopathic chronic gout of multiple sites with tophus: Diagnosed at age 73, family history of gout-paternal grandmother, tophi present overlying both elbows, both hands, and both knees, responsive to prednisone  use, intermittent use of allopurinol : Patient's been experiencing frequent gout flares on a monthly basis for the past 1 year.  He is not currently taking a urate lowering medication but has taken allopurinol  off and on in the past.  He has been off of allopurinol  for the past 1-1/2 years due to not noticed any clinical benefit while taking 400 mg daily consistently for several months.  He has used prednisone  and colchicine  off and on during acute flares.  He was recently evaluated by his PCP on 02/28/2023 at which time he was experiencing a gout flare in his right knee.  He was treated with a  short course of colchicine  and a 6-day prednisone  taper.  He has extensive tophi overlying both elbows, both hands, and both knees--photographs included above. The severity and chronicity of his gout has been significantly interfering with his quality of life to the point that he is unable to work since he is unable to stand for long periods of time without flaring.  He has made dietary changes but has not noticed any relief from the frequency of flares. Previous medications include: indomethacin, colchicine , allopurinol , and prednisone .  Not a good candidate for uloric due to history of MI/triple bypass/hyperlipidemia/hypertension/CAD.   His uric acid level was 10.4 on 02/28/2023.  Creatinine was 1.36 and GFR was 58.82 on 02/28/2023. AST and ALT were within normal limits on 02/28/2023. Different treatment options were discussed today in detail.   Plan to reinitiate allopurinol  and will be titrating the dose until his uric acid level is less than 4.  Plan to initiate the patient on allopurinol  150 mg daily x 1 week then increase to 300 mg daily x 1 week.  He will return for updated lab work in 2 weeks and if labs are stable he will increase to 450 mg daily.  He will take colchicine  0.6 mg 1 tablet daily for at least the first 6 months or until his symptoms are well-controlled.  He has tolerated colchicine  and allopurinol  in the past. If his labs remain stable and his uric acid level is not within a desirable range we will consider adding probenecid  as combination therapy at his follow-up visit.  He will require close lab monitoring going forward.  He was advised to notify us  if he develops signs or symptoms of a gout flare.  A prednisone  taper was sent to the pharmacy today for him to take if he develops signs or symptoms of a gout flare. He will follow up in 3-4 weeks for an office visit.   Rheumatoid factor positive -  RF 26 on 02/28/23-no clinical features of rheumatoid arthritis at this time.  Plan to add on  14 3 3  eta and anti-CCP today for further evaluation.  Positive ANA (antinuclear antibody) - 02/28/23: ANA 1:40NH, uric acid 10.4, CRP 24.0, ESR 104, RF 26: He does not have any clinical features of systemic lupus.  No oral or nasal ulcerations, sicca symptoms, Raynaud's phenomenon, malar rash, or alopecia.   Plan to obtain the following lab work today for further evaluation.  Elevated C-reactive protein (CRP): CRP was elevated at 24.0 and sed rate was elevated at 104 on 02/28/2023.  At that time the patient was having acute gout flare involving the right knee.  He has chronic severe gout and will be following the treatment plan above.   Elevated sed rate: CRP was elevated at 24.0 and sed rate was elevated at 104 on 02/28/2023  Other medical conditions are listed as follows:  Mixed hyperlipidemia  Primary hypertension: Pressure was 114/80 today in the office.  History of coronary artery disease: Not a good candidate for uloric.    History of MI (myocardial infarction):Not a good candidate for Uloric.   Orders: Orders Placed This Encounter  Procedures   CBC with Differential/Platelet   COMPLETE METABOLIC PANEL WITH GFR   Uric acid   Cyclic citrul peptide antibody, IgG   14-3-3 eta Protein   RNP Antibody   Anti-scleroderma antibody   Anti-Smith antibody   Sjogrens syndrome-A extractable nuclear antibody   Sjogrens syndrome-B extractable nuclear antibody   Anti-DNA antibody, double-stranded   C3 and C4   Meds ordered this encounter  Medications   colchicine  0.6 MG tablet    Sig: Take 1 tablet (0.6 mg total) by mouth daily.    Dispense:  90 tablet    Refill:  0   predniSONE  (DELTASONE ) 5 MG tablet    Sig: Take 4 tabs po qd x 3 days, 3  tabs po qd x 3 days, 2  tabs po qd x 3 days, 1  tab po qd x 3 days    Dispense:  30 tablet    Refill:  0   allopurinol  (ZYLOPRIM ) 300 MG tablet    Sig: Take 1/2 tab (150mg ) by mouth daily for 1 week then increase to 1 tab (300mg ) by mouth daily  for 1 week. If labs are stable increase to 1.5 tabs (450mg ) by mouth daily.    Dispense:  32 tablet    Refill:  0      Follow-Up Instructions: Return in about 4 weeks (around 04/21/2023) for NPFU, Gout.   Waddell CHRISTELLA Craze, PA-C  Note - This record has been created using Dragon software.  Chart creation errors have been sought, but may not always  have been located. Such creation errors do not reflect on  the standard of medical care.

## 2023-03-24 ENCOUNTER — Encounter: Payer: Self-pay | Admitting: Physician Assistant

## 2023-03-24 ENCOUNTER — Ambulatory Visit: Payer: Medicaid Other | Attending: Physician Assistant | Admitting: Physician Assistant

## 2023-03-24 VITALS — BP 114/80 | HR 80 | Resp 17 | Ht 70.5 in | Wt 229.0 lb

## 2023-03-24 DIAGNOSIS — Z8739 Personal history of other diseases of the musculoskeletal system and connective tissue: Secondary | ICD-10-CM

## 2023-03-24 DIAGNOSIS — E782 Mixed hyperlipidemia: Secondary | ICD-10-CM

## 2023-03-24 DIAGNOSIS — R700 Elevated erythrocyte sedimentation rate: Secondary | ICD-10-CM

## 2023-03-24 DIAGNOSIS — R768 Other specified abnormal immunological findings in serum: Secondary | ICD-10-CM

## 2023-03-24 DIAGNOSIS — I252 Old myocardial infarction: Secondary | ICD-10-CM

## 2023-03-24 DIAGNOSIS — R7689 Other specified abnormal immunological findings in serum: Secondary | ICD-10-CM

## 2023-03-24 DIAGNOSIS — Z8679 Personal history of other diseases of the circulatory system: Secondary | ICD-10-CM

## 2023-03-24 DIAGNOSIS — M1A09X1 Idiopathic chronic gout, multiple sites, with tophus (tophi): Secondary | ICD-10-CM

## 2023-03-24 DIAGNOSIS — M2550 Pain in unspecified joint: Secondary | ICD-10-CM

## 2023-03-24 DIAGNOSIS — R7982 Elevated C-reactive protein (CRP): Secondary | ICD-10-CM | POA: Diagnosis not present

## 2023-03-24 DIAGNOSIS — I10 Essential (primary) hypertension: Secondary | ICD-10-CM

## 2023-03-24 DIAGNOSIS — Z5181 Encounter for therapeutic drug level monitoring: Secondary | ICD-10-CM

## 2023-03-24 DIAGNOSIS — M1A09X0 Idiopathic chronic gout, multiple sites, without tophus (tophi): Secondary | ICD-10-CM

## 2023-03-24 MED ORDER — COLCHICINE 0.6 MG PO TABS
0.6000 mg | ORAL_TABLET | Freq: Every day | ORAL | 0 refills | Status: DC
Start: 2023-03-24 — End: 2023-06-21

## 2023-03-24 MED ORDER — ALLOPURINOL 300 MG PO TABS
ORAL_TABLET | ORAL | 0 refills | Status: DC
Start: 2023-03-24 — End: 2023-04-20

## 2023-03-24 MED ORDER — PREDNISONE 5 MG PO TABS: ORAL_TABLET | ORAL | 0 refills | Status: DC

## 2023-03-24 NOTE — Patient Instructions (Addendum)
 Take allopurinol  150 mg daily for 1 week Then increase allopurinol  to 300 mg daily x1 week Return for lab work in 2 weeks, if labs are stable increase allopurinol  to 450 mg daily.   Take colchicine  0.6 mg 1 tablet by mouth daily  Return in 3-4 weeks for follow up visit.   Plan to add probenecid  at next visit if labs are stable.   Standing Labs We placed an order today for your standing lab work.   Please have your standing labs drawn in 2 weeks   Please have your labs drawn 2 weeks prior to your appointment so that the provider can discuss your lab results at your appointment, if possible.  Please note that you may see your imaging and lab results in MyChart before we have reviewed them. We will contact you once all results are reviewed. Please allow our office up to 72 hours to thoroughly review all of the results before contacting the office for clarification of your results.  WALK-IN LAB HOURS  Monday through Thursday from 8:00 am -12:30 pm and 1:00 pm-5:00 pm and Friday from 8:00 am-12:00 pm.  Patients with office visits requiring labs will be seen before walk-in labs.  You may encounter longer than normal wait times. Please allow additional time. Wait times may be shorter on  Monday and Thursday afternoons.  We do not book appointments for walk-in labs. We appreciate your patience and understanding with our staff.   Labs are drawn by [facility]. Please bring your co-pay at the time of your lab draw.  You may receive a bill from [facility] for your lab work.  Please note if you are on Hydroxychloroquine and and an order has been placed for a Hydroxychloroquine level,  you will need to have it drawn 4 hours or more after your last dose.  If you wish to have your labs drawn at another location, please call the office 24 hours in advance so we can fax the orders.  The office is located at [address], [place], [place] [address]   If you have any questions regarding directions  or hours of operation,  please call [phone].   As a reminder, please drink plenty of water prior to coming for your lab work. Thanks!     Colchicine  Tablets What is this medication? COLCHICINE  (KOL chi seen) prevents and treats gout attacks. It may also be used to treat familial Mediterranean fever (FMF). It works by decreasing inflammation and reducing the buildup of uric acid in your joints. This medicine may be used for other purposes; ask your health care provider or pharmacist if you have questions. COMMON BRAND NAME(S): Colcrys , LODOCO  What should I tell my care team before I take this medication? They need to know if you have any of these conditions: Kidney disease Liver disease An unusual or allergic reaction to colchicine , other medications, foods, dyes, or preservatives Pregnant or trying to get pregnant Breast-feeding How should I use this medication? Take this medication by mouth with a full glass of water. Take it as directed on the prescription label at the same time every day. You can take it with or without food. If it upsets your stomach, take it with food. A special MedGuide will be given to you by the pharmacist with each prescription and refill. Be sure to read this information carefully each time. Talk to your care team about the use of this medication in children. While it may be prescribed for children as  young as 4 years for selected conditions, precautions do apply. People 65 years and older may have a stronger reaction and need a smaller dose. Overdosage: If you think you have taken too much of this medicine contact a poison control center or emergency room at once. NOTE: This medicine is only for you. Do not share this medicine with others. What if I miss a dose? If you miss a dose, take it as soon as you can. If it is almost time for your next dose, take only that dose. Do not take double or extra doses. What may interact with this medication? Do not take  this medication with any of the following: Certain antivirals for HIV or hepatitis This medication may also interact with the following: Certain antibiotics, such as erythromycin or clarithromycin Certain medications for blood pressure, heart disease, irregular heartbeat Certain medications for cholesterol, such as atorvastatin , lovastatin, or simvastatin Certain medications for fungal infections, such as ketoconazole, itraconazole, or posaconazole Cyclosporine Grapefruit or grapefruit juice This list may not describe all possible interactions. Give your health care provider a list of all the medicines, herbs, non-prescription drugs, or dietary supplements you use. Also tell them if you smoke, drink alcohol, or use illegal drugs. Some items may interact with your medicine. What should I watch for while using this medication? Visit your care team for regular checks on your progress. Tell your care team if your symptoms do not start to get better or if they get worse. You should make sure you get enough vitamin B12 while you are taking this medication. Discuss the foods you eat and the vitamins you take with your care team. This medication may increase your risk to bruise or bleed. Call you care team if you notice any unusual bleeding. What side effects may I notice from receiving this medication? Side effects that you should report to your care team as soon as possible: Allergic reactions--skin rash, itching, hives, swelling of the face, lips, tongue, or throat Infection--fever, chills, cough, sore throat, wounds that don't heal, pain or trouble when passing urine, general feeling of discomfort or being unwell Muscle injury--unusual weakness or fatigue, muscle pain, dark yellow or brown urine, decrease in the amount of urine Pain, tingling, or numbness in the hands or feet Unusual bleeding or bruising Side effects that usually do not require medical attention (report these to your care team if  they continue or are bothersome): Diarrhea Nausea Vomiting This list may not describe all possible side effects. Call your doctor for medical advice about side effects. You may report side effects to FDA at 1-800-FDA-1088. Where should I keep my medication? Keep out of the reach of children and pets. Store at room temperature between 15 and 30 degrees C (59 and 86 degrees F). Keep container tightly closed. Protect from light. Throw away any unused medication after the expiration date. NOTE: This sheet is a summary. It may not cover all possible information. If you have questions about this medicine, talk to your doctor, pharmacist, or health care provider.  2024 Elsevier/Gold Standard (2021-09-01 00:00:00)Allopurinol  Tablets What is this medication?   ALLOPURINOL  (al oh PURE i nole) treats gout or kidney stones. It may also be used to prevent high uric acid levels after chemotherapy. It works by decreasing uric acid levels in your body. This medicine may be used for other purposes; ask your health care provider or pharmacist if you have questions. COMMON BRAND NAME(S): Zyloprim  What should I tell my care team before I  take this medication? They need to know if you have any of these conditions: Kidney disease Liver disease An unusual or allergic reaction to allopurinol , other medications, foods, dyes, or preservatives Pregnant or trying to get pregnant Breast-feeding How should I use this medication? Take this medication by mouth with a full glass of water. Take it as directed on the prescription label at the same time every day. You can take it with or without food. If it upsets your stomach, take it with food. Keep taking it unless your care team tells you to stop. Talk to your care team about the use of this medication in children. While this medication may be prescribed for children for selected conditions, precautions do apply. Overdosage: If you think you have taken too much of this  medicine contact a poison control center or emergency room at once. NOTE: This medicine is only for you. Do not share this medicine with others. What if I miss a dose? If you miss a dose, take it as soon as you can. If it is almost time for your next dose, take only that dose. Do not take double or extra doses. What may interact with this medication? Do not take this medication with the following: Didanosine, ddI This medication may also interact with the following: Certain antibiotics like amoxicillin , ampicillin Certain medications for cancer Certain medications for immunosuppression like azathioprine, cyclosporine, mercaptopurine Chlorpropamide Probenecid  Sulfinpyrazone Thiazide diuretics, like hydrochlorothiazide Warfarin This list may not describe all possible interactions. Give your health care provider a list of all the medicines, herbs, non-prescription drugs, or dietary supplements you use. Also tell them if you smoke, drink alcohol, or use illegal drugs. Some items may interact with your medicine. What should I watch for while using this medication? Visit your care team for regular checks on your progress. If you are taking this medication to treat gout, you may not have less frequent attacks at first. Keep taking your medication regularly and the attacks should get better within 2 to 6 weeks. Drink plenty of water (10 to 12 full glasses a day) while you are taking this medication. This will help to reduce stomach upset and reduce the risk of getting gout or kidney stones. This medication may cause serious skin reactions. They can happen weeks to months after starting the medication. Contact your care team right away if you notice fevers or flu-like symptoms with a rash. The rash may be red or purple and then turn into blisters or peeling of the skin. You may also notice a red rash with swelling of the face, lips, or lymph nodes in your neck or under your arms. Do not take vitamin C  without asking your care team. Too much vitamin C can increase the chance of getting kidney stones. This medication may affect your coordination, reaction time, or judgment. Do not drive or operate machinery until you know how this medication affects you. Sit up or stand slowly to reduce the risk of dizzy or fainting spells. Drinking alcohol with this medication can increase the risk of these side effects. What side effects may I notice from receiving this medication? Side effects that you should report to your care team as soon as possible: Allergic reactions--skin rash, itching, hives, swelling of the face, lips, tongue, or throat Kidney injury--decrease in the amount of urine, swelling of the ankles, hands, or feet Liver injury--right upper belly pain, loss of appetite, nausea, light-colored stool, dark yellow or brown urine, yellowing skin or eyes, unusual  weakness or fatigue Rash, fever, and swollen lymph nodes Redness, blistering, peeling, or loosening of the skin, including inside the mouth Side effects that usually do not require medical attention (report to your care team if they continue or are bothersome): Diarrhea Drowsiness Nausea Vomiting This list may not describe all possible side effects. Call your doctor for medical advice about side effects. You may report side effects to FDA at 1-800-FDA-1088. Where should I keep my medication? Keep out of the reach of children and pets. Store at room temperature between 15 and 25 degrees C (59 and 77 degrees F). Protect from light and moisture. Throw away any unused medication after the expiration date. NOTE: This sheet is a summary. It may not cover all possible information. If you have questions about this medicine, talk to your doctor, pharmacist, or health care provider.  2024 Elsevier/Gold Standard (2021-08-20 00:00:00)   Information for patients with Gout  Gout defined-Gout occurs when urate crystals accumulate in your joint  causing the inflammation and intense pain of gout attack.  Urate crystals can form when you have high levels of uric acid in your blood.  Your body produces uric acid when it breaks down prurines-substances that are found naturally in your body, as well as in certain foods such as organ meats, anchioves, herring, asparagus, and mushrooms.  Normally uric acid dissolves in your blood and passes through your kidneys into your urine.  But sometimes your body either produces too much uric acid or your kidneys excrete too little uric acid.  When this happens, uric acid can build up, forming sharp needle-like urate crystals in a joint or surrounding tissue that cause pain, inflammation and swelling.    Gout is characterized by sudden, severe attacks of pain, redness and tenderness in joints, often the joint at the base of the big toe.  Gout is complex form of arthritis that can affect anyone.  Men are more likely to get gout but women become increasingly more susceptible to gout after menopause.  An acute attack of gout can wake you up in the middle of the night with the sensation that your big toe is on fire.  The affected joint is hot, swollen and so tender that even the weight or the sheet on it may seem intolerable.  If you experience symptoms of an acute gout attack it is important to your doctor as soon as the symptoms start.  Gout that goes untreated can lead to worsening pain and joint damage.  Risk Factors:  You are more likely to develop gout if you have high levels of uric acid in your body.    Factors that increase the uric acid level in your body include:  Lifestyle factors.  Excessive alcohol use-generally more than two drinks a day for men and more than one for women increase the risk of gout.  Medical conditions.  Certain conditions make it more likely that you will develop gout.  These include hypertension, and chronic conditions such as diabetes, high levels of fat and cholesterol in the  blood, and narrowing of the arteries.  Certain medications.  The uses of Thiazide diuretics- commonly used to treat hypertension and low dose aspirin  can also increase uric acid levels.  Family history of gout.  If other members of your family have had gout, you are more likely to develop the disease.  Age and sex. Gout occurs more often in men than it does in women, primarily because women tend to have lower uric  acid levels than men do.  Men are more likely to develop gout earlier usually between the ages of 21-50- whereas women generally develop signs and symptoms after menopause.    Tests and diagnosis:  Tests to help diagnose gout may include:  Blood test.  Your doctor may recommend a blood test to measure the uric acid level in your blood .  Blood tests can be misleading, though.  Some people have high uric acid levels but never experience gout.  And some people have signs and symptoms of gout, but don't have unusual levels of uric acid in their blood.  Joint fluid test.  Your doctor may use a needle to draw fluid from your affected joint.  When examined under the microscope, your joint fluid may reveal urate crystals.  Treatment:  Treatment for gout usually involves medications.  What medications you and your doctor choose will be based on your current health and other medications you currently take.  Gout medications can be used to treat acute gout attacks and prevent future attacks as well as reduce your risk of complications from gout such as the development of tophi from urate crystal deposits.  Alternative medicine:   Certain foods have been studied for their potential to lower uric acid levels, including:  Coffee.  Studies have found an association between coffee drinking (regular and decaf) and lower uric acid levels.  The evidence is not enough to encourage non-coffee drinkers to start, but it may give clues to new ways of treating gout in the future.  Vitamin C.   Supplements containing vitamin C may reduce the levels of uric acid in your blood.  However, vitamin as a treatment for gout. Don't assume that if a little vitamin C is good, than lots is better.  Megadoses of vitamin C may increase your bodies uric acid levels.  Cherries.  Cherries have been associated with lower levels of uric acid in studies, but it isn't clear if they have any effect on gout signs and symptoms.  Eating more cherries and other dar-colored fruits, such as blackberries, blueberries, purple grapes and raspberries, may be a safe way to support your gout treatment.    Lifestyle/Diet Recommendations:  Drink 8 to 16 cups ( about 2 to 4 liters) of fluid each day, with at least half being water. Avoid alcohol Eat a moderate amount of protein, preferably from healthy sources, such as low-fat or fat-free dairy, tofu, eggs, and nut butters. Limit you daily intake of meat, fish, and poultry to 4 to 6 ounces. Avoid high fat meats and desserts. Decrease you intake of shellfish, beef, lamb, pork, eggs and cheese. Choose a good source of vitamin C daily such as citrus fruits, strawberries, broccoli,  brussel sprouts, papaya, and cantaloupe.  Choose a good source of vitamin A every other day such as yellow fruits, or dark green/yellow vegetables. Avoid drastic weigh reduction or fasting.  If weigh loss is desired lose it over a period of several months. See dietary considerations.. chart for specific food recommendations.  Dietary Considerations for people with Gout  Food with negligible purine content (0-15 mg of purine nitrogen per 100 grams food)  May use as desired except on calorie variations  Non fat milk Cocoa Cereals (except in list II) Hard candies  Buttermilk Carbonated drinks Vegetables (except in list II) Sherbet  Coffee Fruits Sugar Honey  Tea Cottage Cheese Gelatin-jell-o Salt  Fruit juice Breads Angel food Cake   Herbs/spices Jams/Jellies [employer]  Foods that do not contain excessive purine content, but must be limited due to fat content  Cream Eggs Oil and Salad Dressing  Half and Half Peanut Butter Chocolate  Whole Milk Cakes Potato Chips  Butter Ice Cream Fried Foods  Cheese Nuts Waffles, pancakes   List II: Food with moderate purine content (50-150 mg of purine nitrogen per 100 grams of food)  Limit total amount each day to 5 oz. cooked Lean meat, other than those on list III   Poultry, other than those on list III Fish, other than those on list III   Seafood, other than those on list III  These foods may be used occasionally  Peas Lentils Bran  Spinach Oatmeal Dried Beans and Peas  Asparagus Wheat Germ Mushrooms   Additional information about meat choices  Choose fish and poultry, particularly without skin, often.  Select lean, well trimmed cuts of meat.  Avoid all fatty meats, bacon , sausage, fried meats, fried fish, or poultry, luncheon meats, cold cuts, hot dogs, meats canned or frozen in gravy, spareribs and frozen and packaged prepared meats.   List III: Foods with HIGH purine content / Foods to AVOID (150-800 mg of purine nitrogen per 100 grams of food)  Anchovies Herring Meat Broths  Liver Mackerel Meat Extracts  Kidney Scallops Meat Drippings  Sardines Wild Game Mincemeat  Sweetbreads Goose Gravy  Heart Tongue Yeast, baker's and brewers   Commercial soups made with any of the foods listed in List II or List III  In addition avoid all alcoholic beverages

## 2023-03-27 NOTE — Progress Notes (Signed)
Results will be discussed at [facility]

## 2023-03-28 ENCOUNTER — Ambulatory Visit: Payer: Medicaid Other | Admitting: Family Medicine

## 2023-03-28 ENCOUNTER — Encounter: Payer: Self-pay | Admitting: Family Medicine

## 2023-03-28 VITALS — BP 120/84 | HR 79 | Temp 97.9°F | Ht 70.5 in | Wt 230.0 lb

## 2023-03-28 DIAGNOSIS — M1A09X0 Idiopathic chronic gout, multiple sites, without tophus (tophi): Secondary | ICD-10-CM

## 2023-03-28 DIAGNOSIS — I10 Essential (primary) hypertension: Secondary | ICD-10-CM | POA: Diagnosis not present

## 2023-03-28 DIAGNOSIS — F330 Major depressive disorder, recurrent, mild: Secondary | ICD-10-CM

## 2023-03-28 DIAGNOSIS — I25709 Atherosclerosis of coronary artery bypass graft(s), unspecified, with unspecified angina pectoris: Secondary | ICD-10-CM

## 2023-03-28 DIAGNOSIS — M0580 Other rheumatoid arthritis with rheumatoid factor of unspecified site: Secondary | ICD-10-CM | POA: Diagnosis not present

## 2023-03-28 NOTE — Progress Notes (Signed)
Established Patient Office Visit  Subjective   Patient ID: Jerry Ostlund., male    DOB: 1970-08-05  Age: 53 y.o. MRN: [id number]  Chief Complaint  Patient presents with   Medical Management of Chronic Issues    4 week follow up. Patient has visited rheumatology. Notes addition of allopurinol since last visit. Patient has been having better blood pressure readings since last in    HPI Patient presents today for medication management. Reports that BPs have been 120s/80s at home. Reports compliance with medication regimen. Denies the need for refills. Has been able to get in with rheum with Sherron Ales, PA and reports that gout is now under control. Denies other concerns. Medical history as outlined below.  ROS Per HPI    Objective:     BP 120/84   Pulse 79   Temp 97.9 F (36.6 C)   Ht 5' 10.5" (1.791 m)   Wt 230 lb (104.3 kg)   SpO2 97%   BMI 32.54 kg/m   Physical Exam Vitals and nursing note reviewed.  Constitutional:      General: He is not in acute distress.    Appearance: Normal appearance.  HENT:     Head: Normocephalic and atraumatic.  Eyes:     Extraocular Movements: Extraocular movements intact.  Cardiovascular:     Rate and Rhythm: Normal rate and regular rhythm.     Pulses: Normal pulses.     Heart sounds: Murmur heard.  Pulmonary:     Effort: Pulmonary effort is normal. No respiratory distress.     Breath sounds: Normal breath sounds. No wheezing, rhonchi or rales.  Musculoskeletal:        General: No swelling or tenderness.     Cervical back: Normal range of motion.     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  Skin:    General: Skin is warm and dry.     Findings: No erythema.  Neurological:     General: No focal deficit present.     Mental Status: He is alert and oriented to person, place, and time.  Psychiatric:        Mood and Affect:  Mood normal.        Behavior: Behavior normal.     No results found for any visits on 03/28/23.   The ASCVD Risk score (Arnett DK, et al., 2019) failed to calculate for the following reasons:   Risk score cannot be calculated because patient has a medical history suggesting prior/existing ASCVD    Assessment & Plan:   Primary hypertension  - controlled, no new meds needed  Chronic gout of multiple sites, unspecified cause  -Continue to follow-up with rheumatology as scheduled -Continue allopurinol and colchicine as prescribed -May take prednisone as needed per rheumatology  Coronary artery disease involving coronary bypass graft of native heart with angina pectoris (HCC)  -Continue metoprolol -Follow-up with cardiology as scheduled  Polyarthritis with positive rheumatoid factor (HCC)  -Continue follow-up with rheumatology as scheduled -Continue allopurinol colchicine as prescribed -May take prednisone as needed per rheumatology'  MDD (major depressive disorder), recurrent episode, mild (HCC)Primary hypertension  Chronic gout of multiple sites, unspecified cause  Coronary artery disease involving coronary bypass graft of native heart with angina pectoris (HCC)  Polyarthritis with positive rheumatoid factor (HCC)  MDD (major depressive disorder), recurrent episode, mild (HCC)   -Denies current depressive symptoms of pain have greatly  improved  Return in about 6 months (around 09/25/2023) for med check.    Moshe Cipro, FNP

## 2023-03-28 NOTE — Patient Instructions (Signed)
Continue current medication regimen.  Follow up with specialists as scheduled.  Follow up with me in 6 mos for medication management, sooner if needed

## 2023-04-01 LAB — ANTI-DNA ANTIBODY, DOUBLE-STRANDED: ds DNA Ab: 34 [IU]/mL — ABNORMAL HIGH

## 2023-04-01 LAB — SJOGRENS SYNDROME-B EXTRACTABLE NUCLEAR ANTIBODY: SSB (La) (ENA) Antibody, IgG: <1 AI

## 2023-04-01 LAB — RNP ANTIBODY: Ribonucleic Protein(ENA) Antibody, IgG: <1 AI

## 2023-04-01 LAB — C3 AND C4
C3 Complement: 149 mg/dL (ref 82–185)
C4 Complement: 24 mg/dL (ref 15–53)

## 2023-04-01 LAB — CYCLIC CITRUL PEPTIDE ANTIBODY, IGG: Cyclic Citrullin Peptide Ab: <16 U

## 2023-04-01 LAB — ANTI-SCLERODERMA ANTIBODY: Scleroderma (Scl-70) (ENA) Antibody, IgG: <1 AI

## 2023-04-01 LAB — SJOGRENS SYNDROME-A EXTRACTABLE NUCLEAR ANTIBODY: SSA (Ro) (ENA) Antibody, IgG: <1 AI

## 2023-04-01 LAB — 14-3-3 ETA PROTEIN: 14-3-3 eta Protein: <0.2 ng/mL (ref <0.2)

## 2023-04-01 LAB — ANTI-SMITH ANTIBODY: ENA SM Ab Ser-aCnc: <1 AI

## 2023-04-10 ENCOUNTER — Other Ambulatory Visit: Payer: Self-pay | Admitting: *Deleted

## 2023-04-10 DIAGNOSIS — Z5181 Encounter for therapeutic drug level monitoring: Secondary | ICD-10-CM

## 2023-04-10 DIAGNOSIS — M1A09X1 Idiopathic chronic gout, multiple sites, with tophus (tophi): Secondary | ICD-10-CM

## 2023-04-11 LAB — COMPLETE METABOLIC PANEL WITH GFR
AG Ratio: 1.8 (calc) (ref 1.0–2.5)
ALT: 29 U/L (ref 9–46)
AST: 26 U/L (ref 10–35)
Albumin: 4.3 g/dL (ref 3.6–5.1)
Alkaline phosphatase (APISO): 108 U/L (ref 35–144)
BUN: 20 mg/dL (ref 7–25)
CO2: 25 mmol/L (ref 20–32)
Calcium: 9.5 mg/dL (ref 8.6–10.3)
Chloride: 102 mmol/L (ref 98–110)
Creat: 1.23 mg/dL (ref 0.70–1.30)
Globulin: 2.4 g/dL (ref 1.9–3.7)
Glucose, Bld: 86 mg/dL (ref 65–99)
Potassium: 4.2 mmol/L (ref 3.5–5.3)
Sodium: 138 mmol/L (ref 135–146)
Total Bilirubin: 0.7 mg/dL (ref 0.2–1.2)
Total Protein: 6.7 g/dL (ref 6.1–8.1)
eGFR: 69 mL/min/{1.73_m2} (ref >=60)

## 2023-04-11 LAB — CBC WITH DIFFERENTIAL/PLATELET
Absolute Lymphocytes: 4284 {cells}/uL — ABNORMAL HIGH (ref 850–3900)
Absolute Monocytes: 622 {cells}/uL (ref 200–950)
Basophils Absolute: 67 {cells}/uL (ref 0–200)
Basophils Relative: 0.8 %
Eosinophils Absolute: 286 {cells}/uL (ref 15–500)
Eosinophils Relative: 3.4 %
HCT: 51.3 % — ABNORMAL HIGH (ref 38.5–50.0)
Hemoglobin: 16.7 g/dL (ref 13.2–17.1)
MCH: 28 pg (ref 27.0–33.0)
MCHC: 32.6 g/dL (ref 32.0–36.0)
MCV: 85.9 fL (ref 80.0–100.0)
MPV: 9.9 fL (ref 7.5–12.5)
Monocytes Relative: 7.4 %
Neutro Abs: 3142 {cells}/uL (ref 1500–7800)
Neutrophils Relative %: 37.4 %
Platelets: 203 10*3/uL (ref 140–400)
RBC: 5.97 10*6/uL — ABNORMAL HIGH (ref 4.20–5.80)
RDW: 14.5 % (ref 11.0–15.0)
Total Lymphocyte: 51 %
WBC: 8.4 10*3/uL (ref 3.8–10.8)

## 2023-04-11 LAB — URIC ACID: Uric Acid, Serum: 7.8 mg/dL (ref 4.0–8.0)

## 2023-04-11 NOTE — Progress Notes (Signed)
 CMP WNL.   CBC stable.  Uric acid has improved-7.8-not yet in goal range.  please clarify what dose of allopurinol he is taking?

## 2023-04-11 NOTE — Progress Notes (Signed)
 Recommend continuing allopurinol 450 mg daily until follow up visit.

## 2023-04-11 NOTE — Progress Notes (Unsigned)
 Office Visit Note  Patient: Jerry Young.             Date of Birth: 1970/08/07           MRN: [id number]             PCP: Moshe Cipro, FNP Referring: Moshe Cipro, FNP Visit Date: 04/20/2023 Occupation: @GUAROCC @  Subjective:  Discuss medication options   History of Present Illness: Koury Roddy. is a 53 y.o. male with history of gout and osteoarthritis.  Patient is currently taking allopurinol 450 mg daily and colchicine 0.6 mg daily.  He is tolerating combination therapy without any side effects and has not had any gaps in therapy.  He denies any major signs or symptoms of a gout flare since initiating allopurinol.  He has not noticed any improvement in his tophi.  He continues to have chronic pain and stiffness in both knees particularly the right knee.  While weightbearing he has a grinding sensation in his right knee. He is open to discussing the next steps in treatment.    Activities of Daily Living:  Patient reports morning stiffness for 5-10 minutes.   Patient Reports nocturnal pain.  Difficulty dressing/grooming: Denies Difficulty climbing stairs: Reports Difficulty getting out of chair: Reports Difficulty using hands for taps, buttons, cutlery, and/or writing: Reports  Review of Systems  Constitutional:  Positive for fatigue.  HENT:  Negative for mouth sores and mouth dryness.   Eyes:  Negative for dryness.  Respiratory:  Positive for shortness of breath.   Cardiovascular:  Positive for palpitations. Negative for chest pain.  Gastrointestinal:  Negative for blood in stool, constipation and diarrhea.  Endocrine: Negative for increased urination.  Genitourinary:  Negative for involuntary urination.  Musculoskeletal:  Positive for joint pain, gait problem, joint pain, joint swelling, myalgias, muscle weakness, morning stiffness, muscle tenderness and myalgias.  Skin:  Positive for sensitivity to sunlight. Negative for color change, rash and hair loss.   Allergic/Immunologic: Negative for susceptible to infections.  Neurological:  Positive for dizziness. Negative for headaches.  Hematological:  Negative for swollen glands.  Psychiatric/Behavioral:  Positive for depressed mood and sleep disturbance. The patient is not nervous/anxious.     PMFS History:  Patient Active Problem List   Diagnosis Date Noted  . Polyarthritis with positive rheumatoid factor (HCC) 03/28/2023  . MDD (major depressive disorder), recurrent episode, mild (HCC) 03/28/2023  . Primary hypertension 02/28/2023  . Mixed hyperlipidemia 02/28/2023  . Chronic gout of multiple sites 02/28/2023  . Coronary artery disease 02/28/2023    Past Medical History:  Diagnosis Date  . Gout   . Hypertension   . Myocardial infarction (HCC) 09/24/19   Had triple bypass surgery    Family History  Problem Relation Age of Onset  . Arthritis Mother   . Arthritis Father   . Heart disease Father   . Hypertension Father   . Kidney disease Father   . Arthritis Paternal Aunt   . Heart disease Paternal Aunt   . Hypertension Paternal Aunt   . Arthritis Paternal Uncle   . Heart disease Paternal Uncle   . Hypertension Paternal Uncle   . Arthritis Maternal Grandmother   . Arthritis Maternal Grandfather   . Cancer Maternal Grandfather   . Heart disease Maternal Grandfather   . Hypertension Maternal Grandfather   . Arthritis Paternal Grandmother   . Arthritis Paternal Grandfather   . Heart disease Paternal Grandfather   . Hypertension Paternal Grandfather    Past Surgical  History:  Procedure Laterality Date  . CORONARY ARTERY BYPASS GRAFT  09/24/19   Triple bypass  . triple bypass  09/23/2020   Social History   Social History Narrative  . Not on file    There is no immunization history on file for this patient.   Objective: Vital Signs: BP 132/80 (BP Location: Left Arm, Patient Position: Sitting, Cuff Size: Large)   Pulse 93   Resp 16   Ht 5' 10.5" (1.791 m)   Wt 231  lb (104.8 kg)   BMI 32.68 kg/m    Physical Exam Vitals and nursing note reviewed.  Constitutional:      Appearance: He is well-developed.  HENT:     Head: Normocephalic and atraumatic.  Eyes:     Conjunctiva/sclera: Conjunctivae normal.     Pupils: Pupils are equal, round, and reactive to light.  Cardiovascular:     Rate and Rhythm: Normal rate and regular rhythm.     Heart sounds: Normal heart sounds.  Pulmonary:     Effort: Pulmonary effort is normal.     Breath sounds: Normal breath sounds.  Abdominal:     General: Bowel sounds are normal.     Palpations: Abdomen is soft.  Musculoskeletal:     Cervical back: Normal range of motion and neck supple.  Skin:    General: Skin is warm and dry.     Capillary Refill: Capillary refill takes less than 2 seconds.  Neurological:     Mental Status: He is alert and oriented to person, place, and time.  Psychiatric:        Behavior: Behavior normal.     Musculoskeletal Exam: C-spine has good ROM.  Some discomfort and stiffness with range of motion of both shoulders.  Limited extension of both elbows.  Limited range of motion of both wrist joints.  DIP prominence noted.  Extensive tophi on the extensor surface of both elbows and overlying several joints of both hands.  No synovitis of MCP joints noted.  Hip joints have slightly limited range of motion.  Limited extension of both knee joints with discomfort in the right knee.  Tophi in the anterior aspect of both knees.  Pedal edema noted in both ankles.  CDAI Exam: CDAI Score: -- Patient Global: --; Provider Global: -- Swollen: --; Tender: -- Joint Exam 04/20/2023   No joint exam has been documented for this visit   There is currently no information documented on the homunculus. Go to the Rheumatology activity and complete the homunculus joint exam.  Investigation: No additional findings.  Imaging: XR KNEE 3 VIEW LEFT Result Date: 04/20/2023 Moderate medial compartment narrowing  was noted.  Intercondylar osteophytes were noted.  Moderate patellofemoral narrowing was noted. Impression: These findings consistent with moderate osteoarthritis and moderate chondromalacia patella.  XR KNEE 3 VIEW RIGHT Result Date: 04/20/2023 Moderate medial compartment narrowing was noted.  Intercondylar osteophytes were noted.  Moderate patellofemoral narrowing was noted. Impression: These findings consistent with moderate osteoarthritis and moderate chondromalacia patella.   Recent Labs: Lab Results  Component Value Date   WBC 8.4 04/10/2023   HGB 16.7 04/10/2023   PLT 203 04/10/2023   NA 138 04/10/2023   K 4.2 04/10/2023   CL 102 04/10/2023   CO2 25 04/10/2023   GLUCOSE 86 04/10/2023   BUN 20 04/10/2023   CREATININE 1.23 04/10/2023   BILITOT 0.7 04/10/2023   ALKPHOS 122 (H) 02/28/2023   AST 26 04/10/2023   ALT 29 04/10/2023   PROT 6.7  04/10/2023   ALBUMIN 4.2 02/28/2023   CALCIUM 9.5 04/10/2023   GFRAA 54 (L) 07/24/2019    Speciality Comments: No specialty comments available.  Procedures:  No procedures performed Allergies: Patient has no known allergies.   Assessment / Plan:     Visit Diagnoses: Idiopathic chronic gout of multiple sites with tophus - Diagnosed at age 74, family history of gout-paternal grandmother, tophi- elbows, both hands,knees, responsive to prednisone, intermittent use of allopurinol: At the patient's initial office visit on 03/24/2023 he was reinitiated on allopurinol which he has titrated to 450 mg daily and is taking colchicine 0.6 mg 1 tablet daily.  He has been tolerating allopurinol and colchicine without any side effects and has not had any signs of a major gout flare.  No improvement of tophi.  No active inflammation noted on examination today.  He continues to have chronic pain and stiffness involving both knees especially his right knee.  X-rays of both knees were obtained today for further evaluation--x-rays were consistent with moderate  osteoarthritis and moderate chondromalacia patella. Previous therapy includes indomethacin, colchicine, allopurinol, and prednisone.  He is not a good candidate for Uloric due to history of MI/triple bypass/hyperlipidemia/hypertension/CAD. His uric acid was 10.4 on 02/28/2023--uric acid improved to 7.8 on 04/10/2023 (increased allopurinol to 450 mg daily on 04/08/23). Plan to continue allopurinol 450 mg daily and colchicine 0.6 mg 1 tablet by mouth daily.  Plan to add probenecid 500 mg 1 tablet twice daily as combination therapy.  Patient was advised to notify us if he cannot tolerate taking probenecid.  Discussed that he may be a good candidate for IV Krystexxa infusions if he continues to have active tophaceous gout.  Patient was given an informational handout about probenecid as well as Krystexxa to review.  Future orders for CBC, CMP, uric acid, and G6PD were placed today.  He will return for updated lab work in 2 weeks.  He will follow-up in the office in 4 weeks or sooner if needed.  Medication monitoring encounter - Allopurinol 450 mg daily, colchicine 0.6 mg daily, and probenecid 500 mg 1 tablet twice daily.   Creatinine improved to 1.23 and GFR was 69 on 04/10/2023. He will require close lab monitoring.  He will return for updated lab work in 2 weeks.  Order for CBC, CMP, uric acid, G6PD were placed today.  Chronic pain of both knees -Tophi noted on the anterior aspect of both knees-unchanged. Patient continues to have chronic pain involving both knees.  He experiences a grinding sensation in his right knee while weightbearing.  X-rays of both knees were obtained today which were consistent with moderate osteoarthritis and moderate chondromalacia patella.  If his right knee persist or worsens we can try a cortisone injection at his follow-up visit.  Viscosupplementation could also be an option in the future.  Plan: XR KNEE 3 VIEW RIGHT, XR KNEE 3 VIEW LEFT  Rheumatoid factor positive - RF 26 on  02/28/23-no clinical features of rheumatoid arthritis at this time.  Positive ANA (antinuclear antibody) - 02/28/23: ANA 1:40NH, uric acid 10.4, CRP 24.0, ESR 104, RF 26: He does not have any clinical features of systemic lupus.  Elevated C-reactive protein (CRP): CRP was elevated at 24.0 and sed rate was elevated at 104 on 02/28/2023.    Elevated sed rate: CRP was elevated at 24.0 and sed rate was elevated at 104 on 02/28/2023.    Other medical conditions are listed as follows:  Mixed hyperlipidemia  Primary hypertension: BP was  132/80 today in the office.   History of coronary artery disease - Not a good candidate for uloric.  History of MI (myocardial infarction) - Not a good candidate for uloric.    Orders: Orders Placed This Encounter  Procedures  . XR KNEE 3 VIEW RIGHT  . XR KNEE 3 VIEW LEFT  . CBC with Differential/Platelet  . COMPLETE METABOLIC PANEL WITH GFR  . Uric acid  . Glucose 6 phosphate dehydrogenase   Meds ordered this encounter  Medications  . probenecid (BENEMID) 500 MG tablet    Sig: Take 1 tablet (500 mg total) by mouth 2 (two) times daily.    Dispense:  60 tablet    Refill:  0      Follow-Up Instructions: Return in about 4 weeks (around 05/18/2023) for Gout.   Gearldine Bienenstock, PA-C  Note - This record has been created using Dragon software.  Chart creation errors have been sought, but may not always  have been located. Such creation errors do not reflect on  the standard of medical care.

## 2023-04-20 ENCOUNTER — Ambulatory Visit: Payer: Medicaid Other | Attending: Physician Assistant | Admitting: Physician Assistant

## 2023-04-20 ENCOUNTER — Ambulatory Visit

## 2023-04-20 ENCOUNTER — Other Ambulatory Visit: Payer: Self-pay

## 2023-04-20 ENCOUNTER — Encounter: Payer: Self-pay | Admitting: Physician Assistant

## 2023-04-20 VITALS — BP 132/80 | HR 93 | Resp 16 | Ht 70.5 in | Wt 231.0 lb

## 2023-04-20 DIAGNOSIS — Z5181 Encounter for therapeutic drug level monitoring: Secondary | ICD-10-CM | POA: Diagnosis not present

## 2023-04-20 DIAGNOSIS — M25561 Pain in right knee: Secondary | ICD-10-CM

## 2023-04-20 DIAGNOSIS — M25562 Pain in left knee: Secondary | ICD-10-CM

## 2023-04-20 DIAGNOSIS — G8929 Other chronic pain: Secondary | ICD-10-CM | POA: Diagnosis not present

## 2023-04-20 DIAGNOSIS — R700 Elevated erythrocyte sedimentation rate: Secondary | ICD-10-CM

## 2023-04-20 DIAGNOSIS — R768 Other specified abnormal immunological findings in serum: Secondary | ICD-10-CM

## 2023-04-20 DIAGNOSIS — I252 Old myocardial infarction: Secondary | ICD-10-CM

## 2023-04-20 DIAGNOSIS — R7982 Elevated C-reactive protein (CRP): Secondary | ICD-10-CM | POA: Diagnosis not present

## 2023-04-20 DIAGNOSIS — M1A09X1 Idiopathic chronic gout, multiple sites, with tophus (tophi): Secondary | ICD-10-CM | POA: Diagnosis not present

## 2023-04-20 DIAGNOSIS — E782 Mixed hyperlipidemia: Secondary | ICD-10-CM

## 2023-04-20 DIAGNOSIS — I10 Essential (primary) hypertension: Secondary | ICD-10-CM

## 2023-04-20 DIAGNOSIS — Z8679 Personal history of other diseases of the circulatory system: Secondary | ICD-10-CM

## 2023-04-20 MED ORDER — PROBENECID 500 MG PO TABS: 500.0000 mg | ORAL_TABLET | Freq: Two times a day (BID) | ORAL | 0 refills | Status: DC

## 2023-04-20 MED ORDER — ALLOPURINOL 300 MG PO TABS: 450.0000 mg | ORAL_TABLET | Freq: Every day | ORAL | 0 refills | Status: DC

## 2023-04-20 NOTE — Progress Notes (Signed)
 X-rays of both knees were consistent with moderate osteoarthritis and moderate chondromalacia patella.  Please notify the patient.

## 2023-04-20 NOTE — Telephone Encounter (Signed)
 Please review and sign pended allopurinol 450mg  daily rx. Thanks!

## 2023-04-20 NOTE — Patient Instructions (Signed)
 Standing Labs We placed an order today for your standing lab work.   Please have your standing labs drawn in 2 weeks.   Please have your labs drawn 2 weeks prior to your appointment so that the provider can discuss your lab results at your appointment, if possible.  Please note that you may see your imaging and lab results in MyChart before we have reviewed them. We will contact you once all results are reviewed. Please allow our office up to 72 hours to thoroughly review all of the results before contacting the office for clarification of your results.  WALK-IN LAB HOURS  Monday through Thursday from 8:00 am -12:30 pm and 1:00 pm-5:00 pm and Friday from 8:00 am-12:00 pm.  Patients with office visits requiring labs will be seen before walk-in labs.  You may encounter longer than normal wait times. Please allow additional time. Wait times may be shorter on  Monday and Thursday afternoons.  We do not book appointments for walk-in labs. We appreciate your patience and understanding with our staff.   Labs are drawn by [facility]. Please bring your co-pay at the time of your lab draw.  You may receive a bill from [facility] for your lab work.  Please note if you are on Hydroxychloroquine and and an order has been placed for a Hydroxychloroquine level,  you will need to have it drawn 4 hours or more after your last dose.  If you wish to have your labs drawn at another location, please call the office 24 hours in advance so we can fax the orders.  The office is located at [address], [place], [place] [address]   If you have any questions regarding directions or hours of operation,  please call [phone].   As a reminder, please drink plenty of water prior to coming for your lab work. Thanks!    Pegloticase Solution for injection What is this medication? PEGLOTICASE (peg LOE ti kase) is an enzyme used to treat chronic gout. This medicine may be used for other purposes; ask your  health care provider or pharmacist if you have questions. This medicine may be used for other purposes; ask your health care provider or pharmacist if you have questions. COMMON BRAND NAME(S): Krystexxa What should I tell my care team before I take this medication? They need to know if you have any of these conditions: G6PD deficiency heart disease an unusual or allergic reaction to pegloticase, other medicines, foods, dyes, or preservatives pregnant or trying to get pregnant breast-feeding How should I use this medication? This medicine is for infusion into a vein. It is given by a health care professional in a hospital or clinic setting. A special MedGuide will be given to you before each treatment. Be sure to read this information carefully each time. Talk to your pediatrician regarding the use of this medicine in children. Special care may be needed. Overdosage: If you think you have taken too much of this medicine contact a poison control center or emergency room at once. NOTE: This medicine is only for you. Do not share this medicine with others. Overdosage: If you think you have taken too much of this medicine contact a poison control center or emergency room at once. NOTE: This medicine is only for you. Do not share this medicine with others. What if I miss a dose? It is important not to miss your dose. Call your doctor or health care professional if you are unable to keep an appointment.  What may interact with this medication? certain medicines for gout like allopurinol, febuxostat, probenecid, sulfinpyrazone This list may not describe all possible interactions. Give your health care provider a list of all the medicines, herbs, non-prescription drugs, or dietary supplements you use. Also tell them if you smoke, drink alcohol, or use illegal drugs. Some items may interact with your medicine. This list may not describe all possible interactions. Give your health care provider a list of  all the medicines, herbs, non-prescription drugs, or dietary supplements you use. Also tell them if you smoke, drink alcohol, or use illegal drugs. Some items may interact with your medicine. What should I watch for while using this medication? This medicine can cause serious allergic reactions. To reduce your risk you may need to take medicine before treatment with this medicine. Take your medicine as directed. Your condition will be monitored carefully while you are receiving this medicine. Tell your doctor or healthcare professional if your symptoms do not start to get better or if they get worse. You will need simple blood tests. What side effects may I notice from receiving this medication? Side effects that you should report to your doctor or health care professional as soon as possible: allergic reactions like skin rash, itching or hives, swelling of the face, lips, or tongue bluish coloring of the skin breathing problems chest pain or chest tightness fast or irregular heartbeat feeling faint or lightheaded, falls pain when urinating swelling of the ankles, feet, hands symptoms of gout unusual bleeding or bruising unusually weak or tired Side effects that usually do not require medical attention (report to your doctor or health care professional if they continue or are bothersome): muscle pain nausea, vomiting upset stomach This list may not describe all possible side effects. Call your doctor for medical advice about side effects. You may report side effects to FDA at 1-800-FDA-1088. This list may not describe all possible side effects. Call your doctor for medical advice about side effects. You may report side effects to FDA at 1-800-FDA-1088. Where should I keep my medication? This drug is given in a hospital or clinic and will not be stored at home. NOTE: This sheet is a summary. It may not cover all possible information. If you have questions about this medicine, talk to your  doctor, pharmacist, or health care provider.  2024 Elsevier/Gold Standard (2015-03-05 00:00:00)

## 2023-05-01 ENCOUNTER — Encounter: Payer: Self-pay | Admitting: Cardiovascular Disease

## 2023-05-01 ENCOUNTER — Ambulatory Visit: Payer: Medicaid Other | Attending: Cardiovascular Disease | Admitting: Cardiovascular Disease

## 2023-05-01 VITALS — BP 156/98 | HR 88 | Ht 70.5 in | Wt 236.0 lb

## 2023-05-01 DIAGNOSIS — I10 Essential (primary) hypertension: Secondary | ICD-10-CM | POA: Diagnosis not present

## 2023-05-01 DIAGNOSIS — E782 Mixed hyperlipidemia: Secondary | ICD-10-CM | POA: Diagnosis not present

## 2023-05-01 DIAGNOSIS — I2510 Atherosclerotic heart disease of native coronary artery without angina pectoris: Secondary | ICD-10-CM | POA: Diagnosis not present

## 2023-05-01 MED ORDER — ATORVASTATIN CALCIUM 40 MG PO TABS: 40.0000 mg | ORAL_TABLET | Freq: Every day | ORAL | 3 refills | Status: DC

## 2023-05-01 NOTE — Assessment & Plan Note (Signed)
 History of essential hypertension her blood pressure measured today at 156/98.  He is on metoprolol.  He says his this blood pressure is unusually high for him.

## 2023-05-01 NOTE — Patient Instructions (Addendum)
 Medication Instructions:  Your physician has recommended you make the following change in your medication:   -Start atorvastatin (lipitor) 40mg  once daily.  *If you need a refill on your cardiac medications before your next appointment, please call your pharmacy*   Lab Work: Your physician recommends that you return for lab work in: 3 months for FASTING lipid/liver panel  If you have labs (blood work) drawn today and your tests are completely normal, you will receive your results only by: MyChart Message (if you have MyChart) OR A paper copy in the mail If you have any lab test that is abnormal or we need to change your treatment, we will call you to review the results.   Testing/Procedures: Your physician has requested that you have an echocardiogram. Echocardiography is a painless test that uses sound waves to create images of your heart. It provides your doctor with information about the size and shape of your heart and how well your heart's chambers and valves are working. This procedure takes approximately one hour. There are no restrictions for this procedure. Please do NOT wear cologne, perfume, aftershave, or lotions (deodorant is allowed). Please arrive 15 minutes prior to your appointment time.  Please note: We ask at that you not bring children with you during ultrasound (echo/ vascular) testing. Due to room size and safety concerns, children are not allowed in the ultrasound rooms during exams. Our front office staff cannot provide observation of children in our lobby area while testing is being conducted. An adult accompanying a patient to their appointment will only be allowed in the ultrasound room at the discretion of the ultrasound technician under special circumstances. We apologize for any inconvenience.    Follow-Up: At [facility], you and your health needs are our priority.  As part of our continuing mission to provide you with exceptional heart care, we  have created designated Provider Care Teams.  These Care Teams include your primary Cardiologist (physician) and Advanced Practice Providers (APPs -  Physician Assistants and Nurse Practitioners) who all work together to provide you with the care you need, when you need it.  We recommend signing up for the patient portal called "MyChart".  Sign up information is provided on this After Visit Summary.  MyChart is used to connect with patients for Virtual Visits (Telemedicine).  Patients are able to view lab/test results, encounter notes, upcoming appointments, etc.  Non-urgent messages can be sent to your provider as well.   To learn more about what you can do with MyChart, go to [email or web address].    Your next appointment:   3 month(s)  Provider:   Marjie Skiff, PA-C, Robet Leu, PA-C, Azalee Course, PA-C, Bernadene Person, NP, or Reather Littler, NP       Then, Nanetta Batty, MD will plan to see you again in 6 month(s).     Other Instructions   1st Floor: - Lobby - Registration  - Pharmacy  - Lab - Cafe  2nd Floor: - PV Lab - Diagnostic Testing (echo, CT, nuclear med)  3rd Floor: - Vacant  4th Floor: - TCTS (cardiothoracic surgery) - AFib Clinic - Structural Heart Clinic - Vascular Surgery  - Vascular Ultrasound  5th Floor: - [facility] (general and EP) - Clinical Pharmacy for coumadin, hypertension, lipid, weight-loss medications, and med management appointments    Valet parking services will be available as well.

## 2023-05-01 NOTE — Assessment & Plan Note (Addendum)
 History of CAD status post CABG x 3 at [facility] 09/23/2020 with a LIMA to his LAD, vein to ramus branch and PDA.Marland Kitchen  He did well for 1 to 2 years thereafter but in the last year has complained of increasing fatigue and shortness of breath but denies chest pain.  I am going to get a 2D echo to further evaluate.

## 2023-05-01 NOTE — Assessment & Plan Note (Signed)
 History of hyperlipidemia not on statin therapy.  His most recent lipid profile performed 02/28/2023 revealed total cholesterol 212, LDL 131 and HDL 53.  I am going to begin him on atorvastatin 40 mg a day and we will recheck a lipid liver profile in 3 months.  LDL goal less than 70 given his CAD.

## 2023-05-01 NOTE — Progress Notes (Signed)
 05/01/2023 Jerry Young.   [birth year]  [id number]  Primary Physician Jerry Cipro, FNP Primary Cardiologist: Jerry Gess MD Jerry Young, [place], [place]  HPI:  Jerry Young. is a 53 y.o. 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  He is referred by Jerry Cipro, FNP to be established because of history of CAD.  His risk factors include treated hypertension and untreated hyperlipidemia.  He is not diabetic.  He is never smoked.  There is no family history of heart disease.  Is never had a stroke.  He has CABG x 3 at [facility] in [place] 09/23/2020.  He did well for 2 years after that but over the last year has noticed increased fatigue and dyspnea.   Current Meds  Medication Sig   acetaminophen (TYLENOL) 500 MG tablet Take 1,500-2,000 mg by mouth every 6 (six) hours as needed. For pain   allopurinol (ZYLOPRIM) 300 MG tablet Take 1.5 tablets (450 mg total) by mouth daily.   atorvastatin (LIPITOR) 40 MG tablet Take 1 tablet (40 mg total) by mouth daily.   colchicine 0.6 MG tablet Take 1 tablet (0.6 mg total) by mouth daily.   metoprolol succinate (TOPROL-XL) 25 MG 24 hr tablet Take 1 tablet (25 mg total) by mouth daily.   predniSONE (DELTASONE) 5 MG tablet Take 4 tabs po qd x 3 days, 3  tabs po qd x 3 days, 2  tabs po qd x 3 days, 1  tab po qd x 3 days   probenecid (BENEMID) 500 MG tablet Take 1 tablet (500 mg total) by mouth 2 (two) times daily.     No Known Allergies  Social History   Socioeconomic History   Marital status: Divorced    Spouse name: Not on file   Number of children: Not on file   Years of education: Not on file   Highest education level: Some college, no degree  Occupational History   Not on file  Tobacco Use   Smoking status: Never    Passive exposure: Past    Smokeless tobacco: Current    Types: Chew   Tobacco comments:    I use chewing tobacco  Vaping Use   Vaping status: Never Used  Substance and Sexual Activity   Alcohol use: Yes    Comment: Varies but not often   Drug use: No   Sexual activity: Not Currently    Birth control/protection: None  Other Topics Concern   Not on file  Social History Narrative   Not on file   Social Drivers of Health   Financial Resource Strain: High Risk (02/04/2023)   Overall Financial Resource Strain (CARDIA)    Difficulty of Paying Living Expenses: Hard  Food Insecurity: Food Insecurity Present (02/04/2023)   Hunger Vital Sign    Worried About Running Out of Food in the Last Year: Sometimes true    Ran Out of Food in the Last Year: Never true  Transportation Needs: No Transportation Needs (02/04/2023)   PRAPARE - Administrator, Civil Service (Medical): No    Lack of Transportation (Non-Medical): No  Physical Activity: Insufficiently Active (02/04/2023)   Exercise Vital Sign    Days of Exercise per Week: 1 day    Minutes of Exercise per Session: 20 min  Stress: Stress Concern Present (02/04/2023)   [employer] of  Occupational Health - Occupational Stress Questionnaire    Feeling of Stress : Rather much  Social Connections: Socially Isolated (02/04/2023)   Social Connection and Isolation Panel [NHANES]    Frequency of Communication with Friends and Family: Three times a week    Frequency of Social Gatherings with Friends and Family: Once a week    Attends Religious Services: Never    Database administrator or Organizations: No    Attends Engineer, structural: Not on file    Marital Status: Divorced  Intimate Partner Violence: Unknown (05/21/2021)   Received from [employer], [facility]   HITS    Physically Hurt: Not on file    Insult or Talk Down To: Not on file    Threaten Physical Harm: Not on file    Scream or Curse: Not on file     Review of  Systems: General: negative for chills, fever, night sweats or weight changes.  Cardiovascular: negative for chest pain, dyspnea on exertion, edema, orthopnea, palpitations, paroxysmal nocturnal dyspnea or shortness of breath Dermatological: negative for rash Respiratory: negative for cough or wheezing Urologic: negative for hematuria Abdominal: negative for nausea, vomiting, diarrhea, bright red blood per rectum, melena, or hematemesis Neurologic: negative for visual changes, syncope, or dizziness All other systems reviewed and are otherwise negative except as noted above.    Blood pressure (!) 156/98, pulse 88, height 5' 10.5" (1.791 m), weight 236 lb (107 kg), SpO2 97%.  General appearance: alert and no distress Neck: no adenopathy, no carotid bruit, no JVD, supple, symmetrical, trachea midline, and thyroid not enlarged, symmetric, no tenderness/mass/nodules Lungs: clear to auscultation bilaterally Heart: regular rate and rhythm, S1, S2 normal, no murmur, click, rub or gallop Extremities: extremities normal, atraumatic, no cyanosis or edema Pulses: 2+ and symmetric Skin: Skin color, texture, turgor normal. No rashes or lesions Neurologic: Grossly normal  EKG EKG Interpretation Date/Time:  Monday May 01 2023 11:24:29 EDT Ventricular Rate:  88 PR Interval:  128 QRS Duration:  102 QT Interval:  366 QTC Calculation: 442 R Axis:   46  Text Interpretation: Normal sinus rhythm Nonspecific T wave abnormality No previous ECGs available Confirmed by Nanetta Batty [id number]) on 05/01/2023 11:26:14 AM    ASSESSMENT AND PLAN:   Primary hypertension History of essential hypertension her blood pressure measured today at 156/98.  He is on metoprolol.  He says his this blood pressure is unusually high for him.  Mixed hyperlipidemia History of hyperlipidemia not on statin therapy.  His most recent lipid profile performed 02/28/2023 revealed total cholesterol 212, LDL 131 and HDL 53.  I am  going to begin him on atorvastatin 40 mg a day and we will recheck a lipid liver profile in 3 months.  LDL goal less than 70 given his CAD.  Coronary artery disease History of CAD status post CABG x 3 at [facility] 09/23/2020 with a LIMA to his LAD, vein to ramus branch and PDA.Marland Kitchen  He did well for 1 to 2 years thereafter but in the last year has complained of increasing fatigue and shortness of breath but denies chest pain.  I am going to get a 2D echo to further evaluate.     Jerry Gess MD FACP,FACC,FAHA, [facility] 05/01/2023 12:11 PM

## 2023-05-03 ENCOUNTER — Other Ambulatory Visit: Payer: Self-pay | Admitting: *Deleted

## 2023-05-03 DIAGNOSIS — Z5181 Encounter for therapeutic drug level monitoring: Secondary | ICD-10-CM

## 2023-05-03 DIAGNOSIS — M1A09X1 Idiopathic chronic gout, multiple sites, with tophus (tophi): Secondary | ICD-10-CM

## 2023-05-04 NOTE — Progress Notes (Unsigned)
 Office Visit Note  Patient: Jerry Young.             Date of Birth: 06/13/1970           MRN: [id number]             PCP: Moshe Cipro, FNP Referring: Moshe Cipro, FNP Visit Date: 05/18/2023 Occupation: @GUAROCC @  Subjective:    History of Present Illness: Harl Wiechmann. is a 53 y.o. male with history of gout. Patient remains on Allopurinol 450 mg daily, colchicine 0.6 mg daily, and probenecid 500 mg 1 tablet twice daily.   CBC and CMP updated on 05/03/23.  Uric acid 05/03/23.   Previous therapy includes indomethacin, colchicine, allopurinol, and prednisone.  He is not a good candidate for Uloric due to history of MI/triple bypass/hyperlipidemia/hypertension/CAD. His uric acid was 10.4 on 02/28/2023--uric acid improved to 7.8 on 04/10/2023 (increased allopurinol to 450 mg daily on 04/08/23). Plan to continue allopurinol 450 mg daily and colchicine 0.6 mg 1 tablet by mouth daily.  Plan to add probenecid 500 mg 1 tablet twice daily as combination therapy.    Activities of Daily Living:  Patient reports morning stiffness for *** {minute/hour:19697}.   Patient {ACTIONS;DENIES/REPORTS:21021675::"Denies"} nocturnal pain.  Difficulty dressing/grooming: {ACTIONS;DENIES/REPORTS:21021675::"Denies"} Difficulty climbing stairs: {ACTIONS;DENIES/REPORTS:21021675::"Denies"} Difficulty getting out of chair: {ACTIONS;DENIES/REPORTS:21021675::"Denies"} Difficulty using hands for taps, buttons, cutlery, and/or writing: {ACTIONS;DENIES/REPORTS:21021675::"Denies"}  No Rheumatology ROS completed.   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/28/2023   MDD (major depressive disorder), recurrent episode, mild (HCC) 03/28/2023   Primary hypertension 02/28/2023   Mixed hyperlipidemia 02/28/2023   Chronic gout of multiple sites 02/28/2023   Coronary artery disease 02/28/2023    Past Medical History:   Diagnosis Date   Gout    Hypertension    Myocardial infarction (HCC) 09/24/19   Had triple bypass surgery    Family History  Problem Relation Age of Onset   Arthritis Mother    Arthritis Father    Heart disease Father    Hypertension Father    Kidney disease Father    Arthritis Paternal Aunt    Heart disease Paternal Aunt    Hypertension Paternal Aunt    Arthritis Paternal Uncle    Heart disease Paternal Uncle    Hypertension Paternal Uncle    Arthritis Maternal Grandmother    Arthritis Maternal Grandfather    Cancer Maternal Grandfather    Heart disease Maternal Grandfather    Hypertension Maternal Grandfather    Arthritis Paternal Grandmother    Arthritis Paternal Grandfather    Heart disease Paternal Grandfather    Hypertension Paternal Grandfather    Past Surgical History:  Procedure Laterality Date   CORONARY ARTERY BYPASS GRAFT  09/24/19   Triple bypass   triple bypass  09/23/2020   Social History   Social History Narrative   Not on file    There is no immunization history on file for this patient.   Objective: Vital Signs: There were no vitals taken for this visit.   Physical Exam Vitals and nursing note reviewed.  Constitutional:      Appearance: He is well-developed.  HENT:     Head: Normocephalic and atraumatic.  Eyes:     Conjunctiva/sclera: Conjunctivae normal.     Pupils: Pupils are equal, round, and reactive to light.  Cardiovascular:     Rate and Rhythm: Normal rate and regular rhythm.     Heart sounds: Normal  heart sounds.  Pulmonary:     Effort: Pulmonary effort is normal.     Breath sounds: Normal breath sounds.  Abdominal:     General: Bowel sounds are normal.     Palpations: Abdomen is soft.  Musculoskeletal:     Cervical back: Normal range of motion and neck supple.  Skin:    General: Skin is warm and dry.     Capillary Refill: Capillary refill takes less than 2 seconds.  Neurological:     Mental Status: He is alert and  oriented to person, place, and time.  Psychiatric:        Behavior: Behavior normal.      Musculoskeletal Exam: ***  CDAI Exam: CDAI Score: -- Patient Global: --; Provider Global: -- Swollen: --; Tender: -- Joint Exam 05/18/2023   No joint exam has been documented for this visit   There is currently no information documented on the homunculus. Go to the Rheumatology activity and complete the homunculus joint exam.  Investigation: No additional findings.  Imaging: XR KNEE 3 VIEW LEFT Result Date: 04/20/2023 Moderate medial compartment narrowing was noted.  Intercondylar osteophytes were noted.  Moderate patellofemoral narrowing was noted. Impression: These findings consistent with moderate osteoarthritis and moderate chondromalacia patella.  XR KNEE 3 VIEW RIGHT Result Date: 04/20/2023 Moderate medial compartment narrowing was noted.  Intercondylar osteophytes were noted.  Moderate patellofemoral narrowing was noted. Impression: These findings consistent with moderate osteoarthritis and moderate chondromalacia patella.   Recent Labs: Lab Results  Component Value Date   WBC 8.3 05/03/2023   HGB 16.9 05/03/2023   PLT 201 05/03/2023   NA 139 05/03/2023   K 4.0 05/03/2023   CL 103 05/03/2023   CO2 25 05/03/2023   GLUCOSE 96 05/03/2023   BUN 15 05/03/2023   CREATININE 1.44 (H) 05/03/2023   BILITOT 0.5 05/03/2023   ALKPHOS 122 (H) 02/28/2023   AST 21 05/03/2023   ALT 16 05/03/2023   PROT 6.3 05/03/2023   ALBUMIN 4.2 02/28/2023   CALCIUM 9.3 05/03/2023   GFRAA 54 (L) 07/24/2019    Speciality Comments: No specialty comments available.  Procedures:  No procedures performed Allergies: Patient has no known allergies.   Assessment / Plan:     Visit Diagnoses: Idiopathic chronic gout of multiple sites with tophus  Medication monitoring encounter  Rheumatoid factor positive  Positive ANA (antinuclear antibody)  Elevated C-reactive protein (CRP)  Elevated sed  rate  Mixed hyperlipidemia  Primary hypertension  History of coronary artery disease  History of MI (myocardial infarction)  Orders: No orders of the defined types were placed in this encounter.  No orders of the defined types were placed in this encounter.    Follow-Up Instructions: No follow-ups on file.   Gearldine Bienenstock, PA-C  Note - This record has been created using Dragon software.  Chart creation errors have been sought, but may not always  have been located. Such creation errors do not reflect on  the standard of medical care.

## 2023-05-04 NOTE — Progress Notes (Signed)
 Creatinine is elevated-1.44.  rest of CMP WNL.  Please advise the patient to increase water intake.   CBC stable.  Uric acid improving--6.0.

## 2023-05-07 LAB — COMPLETE METABOLIC PANEL WITH GFR
AG Ratio: 2.2 (calc) (ref 1.0–2.5)
ALT: 16 U/L (ref 9–46)
AST: 21 U/L (ref 10–35)
Albumin: 4.3 g/dL (ref 3.6–5.1)
Alkaline phosphatase (APISO): 104 U/L (ref 35–144)
BUN/Creatinine Ratio: 10 (calc) (ref 6–22)
BUN: 15 mg/dL (ref 7–25)
CO2: 25 mmol/L (ref 20–32)
Calcium: 9.3 mg/dL (ref 8.6–10.3)
Chloride: 103 mmol/L (ref 98–110)
Creat: 1.44 mg/dL — ABNORMAL HIGH (ref 0.70–1.30)
Globulin: 2 g/dL (ref 1.9–3.7)
Glucose, Bld: 96 mg/dL (ref 65–99)
Potassium: 4 mmol/L (ref 3.5–5.3)
Sodium: 139 mmol/L (ref 135–146)
Total Bilirubin: 0.5 mg/dL (ref 0.2–1.2)
Total Protein: 6.3 g/dL (ref 6.1–8.1)

## 2023-05-07 LAB — CBC WITH DIFFERENTIAL/PLATELET
Absolute Lymphocytes: 3337 {cells}/uL (ref 850–3900)
Absolute Monocytes: 556 {cells}/uL (ref 200–950)
Basophils Absolute: 50 {cells}/uL (ref 0–200)
Basophils Relative: 0.6 %
Eosinophils Absolute: 266 {cells}/uL (ref 15–500)
Eosinophils Relative: 3.2 %
HCT: 51.5 % — ABNORMAL HIGH (ref 38.5–50.0)
Hemoglobin: 16.9 g/dL (ref 13.2–17.1)
MCH: 28.3 pg (ref 27.0–33.0)
MCHC: 32.8 g/dL (ref 32.0–36.0)
MCV: 86.1 fL (ref 80.0–100.0)
MPV: 9.6 fL (ref 7.5–12.5)
Monocytes Relative: 6.7 %
Neutro Abs: 4092 {cells}/uL (ref 1500–7800)
Neutrophils Relative %: 49.3 %
Platelets: 201 10*3/uL (ref 140–400)
RBC: 5.98 10*6/uL — ABNORMAL HIGH (ref 4.20–5.80)
RDW: 15.2 % — ABNORMAL HIGH (ref 11.0–15.0)
Total Lymphocyte: 40.2 %
WBC: 8.3 10*3/uL (ref 3.8–10.8)

## 2023-05-07 LAB — URIC ACID: Uric Acid, Serum: 6 mg/dL (ref 4.0–8.0)

## 2023-05-07 LAB — GLUCOSE 6 PHOSPHATE DEHYDROGENASE: G-6PDH: 15.5 U/g{Hb} (ref 7.0–20.5)

## 2023-05-08 ENCOUNTER — Ambulatory Visit (HOSPITAL_COMMUNITY)
Admission: RE | Admit: 2023-05-08 | Discharge: 2023-05-08 | Disposition: A | Discharge status: Home / Self Care | Source: Ambulatory Visit | Attending: Cardiovascular Disease | Admitting: Cardiovascular Disease

## 2023-05-08 DIAGNOSIS — E782 Mixed hyperlipidemia: Secondary | ICD-10-CM | POA: Diagnosis not present

## 2023-05-08 DIAGNOSIS — I10 Essential (primary) hypertension: Secondary | ICD-10-CM

## 2023-05-08 DIAGNOSIS — I2510 Atherosclerotic heart disease of native coronary artery without angina pectoris: Secondary | ICD-10-CM | POA: Diagnosis not present

## 2023-05-08 LAB — ECHOCARDIOGRAM COMPLETE
AR max vel: 1.94 cm2
AV Area VTI: 1.75 cm2
AV Area mean vel: 1.88 cm2
AV Mean grad: 3 mmHg
AV Peak grad: 5 mmHg
Ao pk vel: 1.12 m/s
Area-P 1/2: 4.15 cm2
S' Lateral: 4.28 cm

## 2023-05-08 NOTE — Progress Notes (Signed)
G6PD WNL

## 2023-05-18 ENCOUNTER — Ambulatory Visit: Attending: Physician Assistant | Admitting: Physician Assistant

## 2023-05-18 ENCOUNTER — Encounter: Payer: Self-pay | Admitting: Physician Assistant

## 2023-05-18 ENCOUNTER — Ambulatory Visit

## 2023-05-18 VITALS — BP 137/76 | HR 77 | Resp 16 | Ht 70.5 in | Wt 236.0 lb

## 2023-05-18 DIAGNOSIS — Z8679 Personal history of other diseases of the circulatory system: Secondary | ICD-10-CM

## 2023-05-18 DIAGNOSIS — M79642 Pain in left hand: Secondary | ICD-10-CM

## 2023-05-18 DIAGNOSIS — R7982 Elevated C-reactive protein (CRP): Secondary | ICD-10-CM

## 2023-05-18 DIAGNOSIS — G8929 Other chronic pain: Secondary | ICD-10-CM | POA: Diagnosis not present

## 2023-05-18 DIAGNOSIS — Z5181 Encounter for therapeutic drug level monitoring: Secondary | ICD-10-CM

## 2023-05-18 DIAGNOSIS — I252 Old myocardial infarction: Secondary | ICD-10-CM

## 2023-05-18 DIAGNOSIS — I10 Essential (primary) hypertension: Secondary | ICD-10-CM

## 2023-05-18 DIAGNOSIS — R700 Elevated erythrocyte sedimentation rate: Secondary | ICD-10-CM

## 2023-05-18 DIAGNOSIS — M25562 Pain in left knee: Secondary | ICD-10-CM | POA: Diagnosis not present

## 2023-05-18 DIAGNOSIS — M1A09X1 Idiopathic chronic gout, multiple sites, with tophus (tophi): Secondary | ICD-10-CM | POA: Diagnosis not present

## 2023-05-18 DIAGNOSIS — M79641 Pain in right hand: Secondary | ICD-10-CM

## 2023-05-18 DIAGNOSIS — M170 Bilateral primary osteoarthritis of knee: Secondary | ICD-10-CM

## 2023-05-18 DIAGNOSIS — E782 Mixed hyperlipidemia: Secondary | ICD-10-CM

## 2023-05-18 DIAGNOSIS — R768 Other specified abnormal immunological findings in serum: Secondary | ICD-10-CM

## 2023-05-18 MED ORDER — TRIAMCINOLONE ACETONIDE 40 MG/ML IJ SUSP
40.0000 mg | INTRAMUSCULAR | Status: AC | PRN
  Administered 2023-05-18: 40 mg via INTRA_ARTICULAR

## 2023-05-18 MED ORDER — LIDOCAINE HCL 1 % IJ SOLN
1.5000 mL | INTRAMUSCULAR | Status: AC | PRN
  Administered 2023-05-18: 1.5 mL

## 2023-05-18 NOTE — Patient Instructions (Addendum)
 Losartan    Standing Labs We placed an order today for your standing lab work.   Please have your labs drawn 2 weeks prior to your appointment so that the provider can discuss your lab results at your appointment, if possible.  Please note that you may see your imaging and lab results in MyChart before we have reviewed them. We will contact you once all results are reviewed. Please allow our office up to 72 hours to thoroughly review all of the results before contacting the office for clarification of your results.  WALK-IN LAB HOURS  Monday through Thursday from 8:00 am -12:30 pm and 1:00 pm-5:00 pm and Friday from 8:00 am-12:00 pm.  Patients with office visits requiring labs will be seen before walk-in labs.  You may encounter longer than normal wait times. Please allow additional time. Wait times may be shorter on  Monday and Thursday afternoons.  We do not book appointments for walk-in labs. We appreciate your patience and understanding with our staff.   Labs are drawn by [facility]. Please bring your co-pay at the time of your lab draw.  You may receive a bill from [facility] for your lab work.  Please note if you are on Hydroxychloroquine and and an order has been placed for a Hydroxychloroquine level,  you will need to have it drawn 4 hours or more after your last dose.  If you wish to have your labs drawn at another location, please call the office 24 hours in advance so we can fax the orders.  The office is located at [address], [place], [place] [address]   If you have any questions regarding directions or hours of operation,  please call [phone].   As a reminder, please drink plenty of water prior to coming for your lab work. Thanks!

## 2023-05-19 NOTE — Progress Notes (Signed)
 X-rays of both hands are consistent with osteoarthritis and inflammatory arthritis overlap.  Erosive changes noted. Results consistent with gouty arthropathy.

## 2023-05-22 ENCOUNTER — Other Ambulatory Visit: Payer: Self-pay | Admitting: *Deleted

## 2023-05-22 DIAGNOSIS — R7982 Elevated C-reactive protein (CRP): Secondary | ICD-10-CM

## 2023-05-22 DIAGNOSIS — M1A09X1 Idiopathic chronic gout, multiple sites, with tophus (tophi): Secondary | ICD-10-CM

## 2023-05-22 DIAGNOSIS — Z5181 Encounter for therapeutic drug level monitoring: Secondary | ICD-10-CM

## 2023-05-22 DIAGNOSIS — R700 Elevated erythrocyte sedimentation rate: Secondary | ICD-10-CM

## 2023-05-23 ENCOUNTER — Encounter: Payer: Self-pay | Admitting: Cardiovascular Disease

## 2023-05-23 ENCOUNTER — Other Ambulatory Visit: Payer: Self-pay | Admitting: *Deleted

## 2023-05-23 ENCOUNTER — Ambulatory Visit: Attending: Physician Assistant | Admitting: Physician Assistant

## 2023-05-23 ENCOUNTER — Ambulatory Visit (INDEPENDENT_AMBULATORY_CARE_PROVIDER_SITE_OTHER): Admitting: Cardiovascular Disease

## 2023-05-23 VITALS — BP 146/91 | HR 80

## 2023-05-23 VITALS — BP 134/88 | HR 81 | Ht 70.5 in | Wt 235.8 lb

## 2023-05-23 DIAGNOSIS — I429 Cardiomyopathy, unspecified: Secondary | ICD-10-CM

## 2023-05-23 DIAGNOSIS — I25810 Atherosclerosis of coronary artery bypass graft(s) without angina pectoris: Secondary | ICD-10-CM

## 2023-05-23 DIAGNOSIS — M25561 Pain in right knee: Secondary | ICD-10-CM | POA: Diagnosis not present

## 2023-05-23 DIAGNOSIS — Z5181 Encounter for therapeutic drug level monitoring: Secondary | ICD-10-CM

## 2023-05-23 DIAGNOSIS — G8929 Other chronic pain: Secondary | ICD-10-CM

## 2023-05-23 DIAGNOSIS — M1A09X1 Idiopathic chronic gout, multiple sites, with tophus (tophi): Secondary | ICD-10-CM

## 2023-05-23 LAB — COMPREHENSIVE METABOLIC PANEL WITH GFR
AG Ratio: 1.7 (calc) (ref 1.0–2.5)
ALT: 20 U/L (ref 9–46)
AST: 18 U/L (ref 10–35)
Albumin: 4.1 g/dL (ref 3.6–5.1)
Alkaline phosphatase (APISO): 112 U/L (ref 35–144)
BUN/Creatinine Ratio: 16 (calc) (ref 6–22)
BUN: 21 mg/dL (ref 7–25)
CO2: 25 mmol/L (ref 20–32)
Calcium: 9.2 mg/dL (ref 8.6–10.3)
Chloride: 104 mmol/L (ref 98–110)
Creat: 1.32 mg/dL — ABNORMAL HIGH (ref 0.70–1.30)
Globulin: 2.4 g/dL (ref 1.9–3.7)
Glucose, Bld: 90 mg/dL (ref 65–99)
Potassium: 4.2 mmol/L (ref 3.5–5.3)
Sodium: 139 mmol/L (ref 135–146)
Total Bilirubin: 0.5 mg/dL (ref 0.2–1.2)
Total Protein: 6.5 g/dL (ref 6.1–8.1)
eGFR: 64 mL/min/{1.73_m2} (ref >=60)

## 2023-05-23 LAB — SEDIMENTATION RATE: Sed Rate: 2 mm/h (ref 0–20)

## 2023-05-23 LAB — C-REACTIVE PROTEIN: CRP: 6.3 mg/L (ref <8.0)

## 2023-05-23 LAB — URIC ACID: Uric Acid, Serum: 5.7 mg/dL (ref 4.0–8.0)

## 2023-05-23 MED ORDER — ALLOPURINOL 300 MG PO TABS: 600.0000 mg | ORAL_TABLET | Freq: Every day | ORAL | Status: DC

## 2023-05-23 MED ORDER — LIDOCAINE HCL 1 % IJ SOLN
1.5000 mL | INTRAMUSCULAR | Status: AC | PRN
  Administered 2023-05-23: 1.5 mL

## 2023-05-23 MED ORDER — TRIAMCINOLONE ACETONIDE 40 MG/ML IJ SUSP
40.0000 mg | INTRAMUSCULAR | Status: AC | PRN
  Administered 2023-05-23: 40 mg via INTRA_ARTICULAR

## 2023-05-23 NOTE — Progress Notes (Signed)
 I reviewed lab work with the patient today in the office. Plan to increase allopurinol to 600 mg daily---please change prescription accordingly (no print). Recheck CMP and uric acid in 2 weeks. Please pend orders.

## 2023-05-23 NOTE — Assessment & Plan Note (Signed)
 History of CAD status post coronary artery bypass grafting x 3 [facility] 09/23/2020 with a LIMA to the LAD, vein to ramus graft plant branch and PDA.  He denies chest pain.  He was complaining of increasing fatigue and shortness of breath.  2D echo performed at [facility] 07/02/2020 revealed EF of 50 to 55%.  2D echo performed here 05/04/2023 revealed a decline in his EF down to 40 to 45% with wall motion abnormalities.  The etiology of this is still unclear.  I decided to proceed with outpatient right left heart cath next week with Dr. Herbie Baltimore.

## 2023-05-23 NOTE — Telephone Encounter (Signed)
-----   Message from Gearldine Bienenstock sent at 05/23/2023  9:32 AM EDT ----- I reviewed lab work with the patient today in the office. Plan to increase allopurinol to 600 mg daily---please change prescription accordingly (no print). Recheck CMP and uric acid in 2 weeks. Please pend orders.

## 2023-05-23 NOTE — H&P (View-Only) (Signed)
 05/23/2023 Jerry Young.   09-25-1970  [id number]  Primary Physician Jerry Barge, FNP Primary Cardiologist: Jerry Gess MD Jerry Young, [place], [place]  HPI:  Jerry Young. is a 53 y.o.   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  He is referred by Jerry Cipro, FNP to be established because of history of CAD.  I last saw him in the office 05/01/2023.  His risk factors include treated hypertension and untreated hyperlipidemia.  He is not diabetic.  He is never smoked.  There is no family history of heart disease.  Is never had a stroke.  He has CABG x 3 at [facility] in [place] 09/23/2020.  He did well for 2 years after that but over the last year has noticed increased fatigue and dyspnea.  I obtain a 2D echo on him 05/08/2023 revealed an EF of 40 to 45% with grade 2 diastolic dysfunction and segmental wall motion abnormalities.  This represents a decline in his EF of 50 to 55% by echo that was done at [facility] 07/02/2020.  Based on this, I decided to proceed with outpatient right left heart cath by Dr. Herbie Young next week.   Current Meds  Medication Sig   acetaminophen (TYLENOL) 500 MG tablet Take 1,500-2,000 mg by mouth every 6 (six) hours as needed. For pain   allopurinol (ZYLOPRIM) 300 MG tablet Take 2 tablets (600 mg total) by mouth daily.   atorvastatin (LIPITOR) 40 MG tablet Take 1 tablet (40 mg total) by mouth daily.   colchicine 0.6 MG tablet Take 1 tablet (0.6 mg total) by mouth daily.   metoprolol succinate (TOPROL-XL) 25 MG 24 hr tablet Take 1 tablet (25 mg total) by mouth daily.   predniSONE (DELTASONE) 5 MG tablet Take 4 tabs po qd x 3 days, 3  tabs po qd x 3 days, 2  tabs po qd x 3 days, 1  tab po qd x 3 days   probenecid (BENEMID) 500 MG tablet Take  1 tablet (500 mg total) by mouth 2 (two) times daily.     No Known Allergies  Social History   Socioeconomic History   Marital status: Divorced    Spouse name: Not on file   Number of children: Not on file   Years of education: Not on file   Highest education level: Some college, no degree  Occupational History   Not on file  Tobacco Use   Smoking status: Never    Passive exposure: Past   Smokeless tobacco: Current    Types: Chew   Tobacco comments:    I use chewing tobacco  Vaping Use   Vaping status: Never Used  Substance and Sexual Activity   Alcohol use: Yes    Comment: Varies but not often   Drug use: No   Sexual activity: Not Currently    Birth control/protection: None  Other Topics Concern   Not on file  Social History Narrative   Not on file   Social Drivers of Health   Financial Resource Strain: High Risk (02/04/2023)   Overall Financial Resource Strain (CARDIA)    Difficulty of Paying Living Expenses: Hard  Food Insecurity: Food Insecurity Present (02/04/2023)   Hunger Vital Sign    Worried About Running Out of Food in the Last Year: Sometimes true  Ran Out of Food in the Last Year: Never true  Transportation Needs: No Transportation Needs (02/04/2023)   PRAPARE - Administrator, Civil Service (Medical): No    Lack of Transportation (Non-Medical): No  Physical Activity: Insufficiently Active (02/04/2023)   Exercise Vital Sign    Days of Exercise per Week: 1 day    Minutes of Exercise per Session: 20 min  Stress: Stress Concern Present (02/04/2023)   Jerry Young of Occupational Health - Occupational Stress Questionnaire    Feeling of Stress : Rather much  Social Connections: Socially Isolated (02/04/2023)   Social Connection and Isolation Panel [NHANES]    Frequency of Communication with Friends and Family: Three times a week    Frequency of Social Gatherings with Friends and Family: Once a week    Attends Religious Services:  Never    Database administrator or Organizations: No    Attends Engineer, structural: Not on file    Marital Status: Divorced  Intimate Partner Violence: Unknown (05/21/2021)   Received from [employer], [facility]   HITS    Physically Hurt: Not on file    Insult or Talk Down To: Not on file    Threaten Physical Harm: Not on file    Scream or Curse: Not on file     Review of Systems: General: negative for chills, fever, night sweats or weight changes.  Cardiovascular: negative for chest pain, dyspnea on exertion, edema, orthopnea, palpitations, paroxysmal nocturnal dyspnea or shortness of breath Dermatological: negative for rash Respiratory: negative for cough or wheezing Urologic: negative for hematuria Abdominal: negative for nausea, vomiting, diarrhea, bright red blood per rectum, melena, or hematemesis Neurologic: negative for visual changes, syncope, or dizziness All other systems reviewed and are otherwise negative except as noted above.    Blood pressure 134/88, pulse 81, height 5' 10.5" (1.791 m), weight 235 lb 12.8 oz (107 kg), SpO2 98%.  General appearance: alert and no distress Neck: no adenopathy, no carotid bruit, no JVD, supple, symmetrical, trachea midline, and thyroid not enlarged, symmetric, no tenderness/mass/nodules Lungs: clear to auscultation bilaterally Heart: regular rate and rhythm, S1, S2 normal, no murmur, click, rub or gallop Extremities: extremities normal, atraumatic, no cyanosis or edema Pulses: 2+ and symmetric Skin: Skin color, texture, turgor normal. No rashes or lesions Neurologic: Grossly normal  EKG not performed today      ASSESSMENT AND PLAN:   Coronary artery disease History of CAD status post coronary artery bypass grafting x 3 [facility] 09/23/2020 with a LIMA to the LAD, vein to ramus graft plant branch and PDA.  He denies chest pain.  He was complaining of increasing fatigue and shortness of breath.   2D echo performed at [facility] 07/02/2020 revealed EF of 50 to 55%.  2D echo performed here 05/04/2023 revealed a decline in his EF down to 40 to 45% with wall motion abnormalities.  The etiology of this is still unclear.  I decided to proceed with outpatient right left heart cath next week with Dr. Herbie Young.     Jerry Gess MD FACP,FACC,FAHA, [facility] 05/23/2023 4:21 PM

## 2023-05-23 NOTE — Progress Notes (Signed)
   Procedure Note  Patient: Jerry Young.             Date of Birth: [birth year]           MRN: [id number]             Visit Date: 05/23/2023  Procedures: Visit Diagnoses: No diagnosis found.  Large Joint Inj: R knee on 05/23/2023 9:44 AM Indications: pain Details: 27 G 1.5 in needle, medial approach  Arthrogram: No  Medications: 1.5 mL lidocaine 1 %; 40 mg triamcinolone acetonide 40 MG/ML Aspirate: 0 mL Outcome: tolerated well, no immediate complications Procedure, treatment alternatives, risks and benefits explained, specific risks discussed. Consent was given by the patient. Immediately prior to procedure a time out was called to verify the correct patient, procedure, equipment, support staff and site/side marked as required. Patient was prepped and draped in the usual sterile fashion.     Discussed lab results with the patient today while he was in the office.  Uric acid has improved to 5.7.  Sed rate and CRP were within normal limits.  Creatinine has improved and GFR was 64.  Plan to increase allopurinol to 600 mg daily.  He will remain on colchicine and probenecid as prescribed.  He will return for updated lab work in 2 weeks including CMP and uric acid.  He will follow-up in the office in 1 month.  Patient tolerated procedure well.  Procedure was completed above.  Aftercare was discussed.   His blood pressure was slightly elevated today while in the office.  It was rechecked prior to administering the cortisone injection.  Patient was advised to monitor blood pressure closely following cortisone injection today.  Discussed the option of possibly adding on losartan for blood pressure control.  Discussed that losartan has also shown to help reduce uric acid levels.  He has a follow-up visit scheduled with his cardiologist today at which time he plans on further discussing options.  Sherron Ales, PA-C

## 2023-05-23 NOTE — Patient Instructions (Signed)
 Losartan

## 2023-05-23 NOTE — Patient Instructions (Addendum)
 Jerry Young  Testing/Procedures:     Cardiac Catheterization   You are scheduled for a Cardiac Catheterization on Friday, April 11 with Dr. Bryan Lemma.  1. Please arrive at the [facility] (Main Entrance A) at [facility]: [address] [place], [place] [address] at 7:00 AM (This time is 2 hour(s) before your procedure to ensure your preparation).   Free valet parking service is available. You will check in at ADMITTING. The support person will be asked to wait in the waiting room.  It is OK to have someone drop you off and come back when you are ready to be discharged.        Special note: Every effort is made to have your procedure done on time. Please understand that emergencies sometimes delay scheduled procedures.  2. Diet: Do not eat solid foods after midnight.  You may have clear liquids until 5 AM the day of the procedure.  3. Labs: none needed  4. Medication instructions in preparation for your procedure:   On the morning of your procedure, take ASPIRIN 81 MG ONCE DAILY and any morning medicines.  You may use sips of water.  5. Plan to go home the same day, you will only stay overnight if medically necessary. 6. You MUST have a responsible adult to drive you home. 7. An adult MUST be with you the first 24 hours after you arrive home. 8. Bring a current list of your medications, and the last time and date medication taken. 9. Bring ID and current insurance cards. 10.Please wear clothes that are easy to get on and off and wear slip-on shoes.  Thank you for allowing [place] to care for you!   -- [facility]   Follow-Up: At [facility], you and your health needs are our priority.  As part of our continuing mission to provide you with exceptional heart care, our providers are all part of one team.  This team includes your primary Cardiologist (physician) and Advanced Practice Providers or APPs (Physician Assistants and Nurse  Practitioners) who all work together to provide you with the care you need, when you need it.  Your next appointment:   4 week(s)  Provider:   Nanetta Batty MD         1st Floor: - Lobby - Registration  - Pharmacy  - Lab - Cafe  2nd Floor: - PV Lab - Diagnostic Testing (echo, CT, nuclear med)  3rd Floor: - Vacant  4th Floor: - TCTS (cardiothoracic surgery) - AFib Clinic - Structural Heart Clinic - Vascular Surgery  - Vascular Ultrasound  5th Floor: - HeartCare Cardiology (general and EP) - Clinical Pharmacy for coumadin, hypertension, lipid, weight-loss medications, and med management appointments    Valet parking services will be available as well.

## 2023-05-23 NOTE — Progress Notes (Signed)
 05/23/2023 Jerry Young.   [birth year]  [id number]  Primary Physician Jerry Barge, FNP Primary Cardiologist: Jerry Gess MD Jerry Young, [place], [place]  HPI:  Jerry Young. is a 53 y.o.   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  He is referred by Jerry Cipro, FNP to be established because of history of CAD.  I last saw him in the office 05/01/2023.  His risk factors include treated hypertension and untreated hyperlipidemia.  He is not diabetic.  He is never smoked.  There is no family history of heart disease.  Is never had a stroke.  He has CABG x 3 at [facility] in [place] 09/23/2020.  He did well for 2 years after that but over the last year has noticed increased fatigue and dyspnea.  I obtained a 2D echo reviewed revealing EF of 45 to 45% with segmental wall motion abnormalities.  This represents an significant decline compared to his last EF 07/02/2020 which time it was 50 to 55%.  Based on this, I decided to proceed with outpatient diagnostic coronary angiography by Dr. Herbie Young next week.   Current Meds  Medication Sig   acetaminophen (TYLENOL) 500 MG tablet Take 1,500-2,000 mg by mouth every 6 (six) hours as needed. For pain   allopurinol (ZYLOPRIM) 300 MG tablet Take 2 tablets (600 mg total) by mouth daily.   atorvastatin (LIPITOR) 40 MG tablet Take 1 tablet (40 mg total) by mouth daily.   colchicine 0.6 MG tablet Take 1 tablet (0.6 mg total) by mouth daily.   metoprolol succinate (TOPROL-XL) 25 MG 24 hr tablet Take 1 tablet (25 mg total) by mouth daily.   predniSONE (DELTASONE) 5 MG tablet Take 4 tabs po qd x 3 days, 3  tabs po qd x 3 days, 2  tabs po qd x 3 days, 1  tab po qd x 3 days   probenecid (BENEMID) 500 MG tablet Take 1 tablet (500 mg total) by mouth 2 (two) times  daily.     No Known Allergies  Social History   Socioeconomic History   Marital status: Divorced    Spouse name: Not on file   Number of children: Not on file   Years of education: Not on file   Highest education level: Some college, no degree  Occupational History   Not on file  Tobacco Use   Smoking status: Never    Passive exposure: Past   Smokeless tobacco: Current    Types: Chew   Tobacco comments:    I use chewing tobacco  Vaping Use   Vaping status: Never Used  Substance and Sexual Activity   Alcohol use: Yes    Comment: Varies but not often   Drug use: No   Sexual activity: Not Currently    Birth control/protection: None  Other Topics Concern   Not on file  Social History Narrative   Not on file   Social Drivers of Health   Financial Resource Strain: High Risk (02/04/2023)   Overall Financial Resource Strain (CARDIA)    Difficulty of Paying Living Expenses: Hard  Food Insecurity: Food Insecurity Present (02/04/2023)   Hunger Vital Sign    Worried About Running Out of Food in the Last Year: Sometimes true    Ran Out of Food in the Last Year: Never true  Transportation Needs:  No Transportation Needs (02/04/2023)   PRAPARE - Administrator, Civil Service (Medical): No    Lack of Transportation (Non-Medical): No  Physical Activity: Insufficiently Active (02/04/2023)   Exercise Vital Sign    Days of Exercise per Week: 1 day    Minutes of Exercise per Session: 20 min  Stress: Stress Concern Present (02/04/2023)   [employer] of Occupational Health - Occupational Stress Questionnaire    Feeling of Stress : Rather much  Social Connections: Socially Isolated (02/04/2023)   Social Connection and Isolation Panel [NHANES]    Frequency of Communication with Friends and Family: Three times a week    Frequency of Social Gatherings with Friends and Family: Once a week    Attends Religious Services: Never    Database administrator or Organizations:  No    Attends Engineer, structural: Not on file    Marital Status: Divorced  Intimate Partner Violence: Unknown (05/21/2021)   Received from [employer], [facility]   HITS    Physically Hurt: Not on file    Insult or Talk Down To: Not on file    Threaten Physical Harm: Not on file    Scream or Curse: Not on file     Review of Systems: General: negative for chills, fever, night sweats or weight changes.  Cardiovascular: negative for chest pain, dyspnea on exertion, edema, orthopnea, palpitations, paroxysmal nocturnal dyspnea or shortness of breath Dermatological: negative for rash Respiratory: negative for cough or wheezing Urologic: negative for hematuria Abdominal: negative for nausea, vomiting, diarrhea, bright red blood per rectum, melena, or hematemesis Neurologic: negative for visual changes, syncope, or dizziness All other systems reviewed and are otherwise negative except as noted above.    Blood pressure 134/88, pulse 81, height 5' 10.5" (1.791 m), weight 235 lb 12.8 oz (107 kg), SpO2 98%.  General appearance: alert and no distress Neck: no adenopathy, no carotid bruit, no JVD, supple, symmetrical, trachea midline, and thyroid not enlarged, symmetric, no tenderness/mass/nodules Lungs: clear to auscultation bilaterally Heart: regular rate and rhythm, S1, S2 normal, no murmur, click, rub or gallop Extremities: extremities normal, atraumatic, no cyanosis or edema Pulses: 2+ and symmetric Skin: Skin color, texture, turgor normal. No rashes or lesions Neurologic: Grossly normal  EKG not performed today      ASSESSMENT AND PLAN:   Coronary artery disease History of CAD status post coronary artery bypass grafting x 3 [facility] 09/23/2020 with a LIMA to the LAD, vein to ramus graft plant branch and PDA.  He denies chest pain.  He was complaining of increasing fatigue and shortness of breath.  2D echo performed at [facility] 07/02/2020  revealed EF of 50 to 55%.  2D echo performed here 05/04/2023 revealed a decline in his EF down to 40 to 45% with wall motion abnormalities.  The etiology of this is still unclear.  I decided to proceed with outpatient right left heart cath next week with Dr. Herbie Young.     Jerry Gess MD FACP,FACC,FAHA, [facility] 05/23/2023 4:24 PM

## 2023-05-23 NOTE — Progress Notes (Signed)
 05/23/2023 Jerry Young.   09-25-1970  [id number]  Primary Physician Sherald Barge, FNP Primary Cardiologist: Runell Gess MD [name], [place], [place]  HPI:  Jerry Young. is a 53 y.o.   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  He is referred by Moshe Cipro, FNP to be established because of history of CAD.  I last saw him in the office 05/01/2023.  His risk factors include treated hypertension and untreated hyperlipidemia.  He is not diabetic.  He is never smoked.  There is no family history of heart disease.  Is never had a stroke.  He has CABG x 3 at [facility] in [place] 09/23/2020.  He did well for 2 years after that but over the last year has noticed increased fatigue and dyspnea.  I obtain a 2D echo on him 05/08/2023 revealed an EF of 40 to 45% with grade 2 diastolic dysfunction and segmental wall motion abnormalities.  This represents a decline in his EF of 50 to 55% by echo that was done at [facility] 07/02/2020.  Based on this, I decided to proceed with outpatient right left heart cath by Dr. Herbie Baltimore next week.   Current Meds  Medication Sig   acetaminophen (TYLENOL) 500 MG tablet Take 1,500-2,000 mg by mouth every 6 (six) hours as needed. For pain   allopurinol (ZYLOPRIM) 300 MG tablet Take 2 tablets (600 mg total) by mouth daily.   atorvastatin (LIPITOR) 40 MG tablet Take 1 tablet (40 mg total) by mouth daily.   colchicine 0.6 MG tablet Take 1 tablet (0.6 mg total) by mouth daily.   metoprolol succinate (TOPROL-XL) 25 MG 24 hr tablet Take 1 tablet (25 mg total) by mouth daily.   predniSONE (DELTASONE) 5 MG tablet Take 4 tabs po qd x 3 days, 3  tabs po qd x 3 days, 2  tabs po qd x 3 days, 1  tab po qd x 3 days   probenecid (BENEMID) 500 MG tablet Take  1 tablet (500 mg total) by mouth 2 (two) times daily.     No Known Allergies  Social History   Socioeconomic History   Marital status: Divorced    Spouse name: Not on file   Number of children: Not on file   Years of education: Not on file   Highest education level: Some college, no degree  Occupational History   Not on file  Tobacco Use   Smoking status: Never    Passive exposure: Past   Smokeless tobacco: Current    Types: Chew   Tobacco comments:    I use chewing tobacco  Vaping Use   Vaping status: Never Used  Substance and Sexual Activity   Alcohol use: Yes    Comment: Varies but not often   Drug use: No   Sexual activity: Not Currently    Birth control/protection: None  Other Topics Concern   Not on file  Social History Narrative   Not on file   Social Drivers of Health   Financial Resource Strain: High Risk (02/04/2023)   Overall Financial Resource Strain (CARDIA)    Difficulty of Paying Living Expenses: Hard  Food Insecurity: Food Insecurity Present (02/04/2023)   Hunger Vital Sign    Worried About Running Out of Food in the Last Year: Sometimes true  Ran Out of Food in the Last Year: Never true  Transportation Needs: No Transportation Needs (02/04/2023)   PRAPARE - Administrator, Civil Service (Medical): No    Lack of Transportation (Non-Medical): No  Physical Activity: Insufficiently Active (02/04/2023)   Exercise Vital Sign    Days of Exercise per Week: 1 day    Minutes of Exercise per Session: 20 min  Stress: Stress Concern Present (02/04/2023)   [employer] of Occupational Health - Occupational Stress Questionnaire    Feeling of Stress : Rather much  Social Connections: Socially Isolated (02/04/2023)   Social Connection and Isolation Panel [NHANES]    Frequency of Communication with Friends and Family: Three times a week    Frequency of Social Gatherings with Friends and Family: Once a week    Attends Religious Services:  Never    Database administrator or Organizations: No    Attends Engineer, structural: Not on file    Marital Status: Divorced  Intimate Partner Violence: Unknown (05/21/2021)   Received from [employer], [facility]   HITS    Physically Hurt: Not on file    Insult or Talk Down To: Not on file    Threaten Physical Harm: Not on file    Scream or Curse: Not on file     Review of Systems: General: negative for chills, fever, night sweats or weight changes.  Cardiovascular: negative for chest pain, dyspnea on exertion, edema, orthopnea, palpitations, paroxysmal nocturnal dyspnea or shortness of breath Dermatological: negative for rash Respiratory: negative for cough or wheezing Urologic: negative for hematuria Abdominal: negative for nausea, vomiting, diarrhea, bright red blood per rectum, melena, or hematemesis Neurologic: negative for visual changes, syncope, or dizziness All other systems reviewed and are otherwise negative except as noted above.    Blood pressure 134/88, pulse 81, height 5' 10.5" (1.791 m), weight 235 lb 12.8 oz (107 kg), SpO2 98%.  General appearance: alert and no distress Neck: no adenopathy, no carotid bruit, no JVD, supple, symmetrical, trachea midline, and thyroid not enlarged, symmetric, no tenderness/mass/nodules Lungs: clear to auscultation bilaterally Heart: regular rate and rhythm, S1, S2 normal, no murmur, click, rub or gallop Extremities: extremities normal, atraumatic, no cyanosis or edema Pulses: 2+ and symmetric Skin: Skin color, texture, turgor normal. No rashes or lesions Neurologic: Grossly normal  EKG not performed today      ASSESSMENT AND PLAN:   Coronary artery disease History of CAD status post coronary artery bypass grafting x 3 [facility] 09/23/2020 with a LIMA to the LAD, vein to ramus graft plant branch and PDA.  He denies chest pain.  He was complaining of increasing fatigue and shortness of breath.   2D echo performed at [facility] 07/02/2020 revealed EF of 50 to 55%.  2D echo performed here 05/04/2023 revealed a decline in his EF down to 40 to 45% with wall motion abnormalities.  The etiology of this is still unclear.  I decided to proceed with outpatient right left heart cath next week with Dr. Herbie Baltimore.     Runell Gess MD FACP,FACC,FAHA, [facility] 05/23/2023 4:21 PM

## 2023-05-25 ENCOUNTER — Telehealth: Payer: Self-pay | Admitting: *Deleted

## 2023-05-25 NOTE — Telephone Encounter (Signed)
 Cardiac Catheterization scheduled at [facility] for: Friday April 25, 2023 9 AM Arrival time [facility] Main Entrance A at: 7 AM  Nothing to eat after midnight prior to procedure, clear liquids until 5 AM day of procedure.  Medication instructions: -Usual morning medications can be taken with sips of water including aspirin 81 mg.  Plan to go home the same day, you will only stay overnight if medically necessary.  You must have responsible adult to drive you home.  Someone must be with you the first 24 hours after you arrive home.  Reviewed procedure instructions with patient.

## 2023-05-26 ENCOUNTER — Other Ambulatory Visit: Payer: Self-pay

## 2023-05-26 ENCOUNTER — Ambulatory Visit (HOSPITAL_COMMUNITY)
Admission: RE | Admit: 2023-05-26 | Discharge: 2023-05-26 | Disposition: A | Discharge status: Home / Self Care | Attending: Cardiology | Admitting: Cardiology

## 2023-05-26 ENCOUNTER — Encounter (HOSPITAL_COMMUNITY)
Admission: RE | Disposition: A | Discharge status: Home / Self Care | Payer: Self-pay | Source: Home / Self Care | Attending: Cardiology

## 2023-05-26 DIAGNOSIS — I5040 Unspecified combined systolic (congestive) and diastolic (congestive) heart failure: Secondary | ICD-10-CM | POA: Diagnosis not present

## 2023-05-26 DIAGNOSIS — Z79899 Other long term (current) drug therapy: Secondary | ICD-10-CM | POA: Diagnosis not present

## 2023-05-26 DIAGNOSIS — Z951 Presence of aortocoronary bypass graft: Secondary | ICD-10-CM | POA: Insufficient documentation

## 2023-05-26 DIAGNOSIS — E785 Hyperlipidemia, unspecified: Secondary | ICD-10-CM | POA: Insufficient documentation

## 2023-05-26 DIAGNOSIS — I429 Cardiomyopathy, unspecified: Secondary | ICD-10-CM | POA: Diagnosis not present

## 2023-05-26 DIAGNOSIS — I110 Hypertensive heart disease with heart failure: Secondary | ICD-10-CM | POA: Insufficient documentation

## 2023-05-26 DIAGNOSIS — I25118 Atherosclerotic heart disease of native coronary artery with other forms of angina pectoris: Secondary | ICD-10-CM | POA: Insufficient documentation

## 2023-05-26 DIAGNOSIS — I2582 Chronic total occlusion of coronary artery: Secondary | ICD-10-CM | POA: Insufficient documentation

## 2023-05-26 DIAGNOSIS — I10 Essential (primary) hypertension: Secondary | ICD-10-CM

## 2023-05-26 HISTORY — PX: RIGHT/LEFT HEART CATH AND CORONARY/GRAFT ANGIOGRAPHY: CATH118267

## 2023-05-26 LAB — POCT I-STAT EG7
Acid-base deficit: 1 mmol/L (ref 0.0–2.0)
Acid-base deficit: 2 mmol/L (ref 0.0–2.0)
Bicarbonate: 25.8 mmol/L (ref 20.0–28.0)
Bicarbonate: 25.9 mmol/L (ref 20.0–28.0)
Calcium, Ion: 1.23 mmol/L (ref 1.15–1.40)
Calcium, Ion: 1.24 mmol/L (ref 1.15–1.40)
HCT: 52 % (ref 39.0–52.0)
HCT: 52 % (ref 39.0–52.0)
Hemoglobin: 17.7 g/dL — ABNORMAL HIGH (ref 13.0–17.0)
Hemoglobin: 17.7 g/dL — ABNORMAL HIGH (ref 13.0–17.0)
O2 Saturation: 62 %
O2 Saturation: 66 %
Potassium: 4.2 mmol/L (ref 3.5–5.1)
Potassium: 4.3 mmol/L (ref 3.5–5.1)
Sodium: 137 mmol/L (ref 135–145)
Sodium: 138 mmol/L (ref 135–145)
TCO2: 27 mmol/L (ref 22–32)
TCO2: 27 mmol/L (ref 22–32)
pCO2, Ven: 51.7 mmHg (ref 44–60)
pCO2, Ven: 52.4 mmHg (ref 44–60)
pH, Ven: 7.3 (ref 7.25–7.43)
pH, Ven: 7.308 (ref 7.25–7.43)
pO2, Ven: 36 mmHg (ref 32–45)
pO2, Ven: 38 mmHg (ref 32–45)

## 2023-05-26 LAB — POCT I-STAT 7, (LYTES, BLD GAS, ICA,H+H)
Acid-base deficit: 2 mmol/L (ref 0.0–2.0)
Bicarbonate: 24.8 mmol/L (ref 20.0–28.0)
Calcium, Ion: 1.25 mmol/L (ref 1.15–1.40)
HCT: 53 % — ABNORMAL HIGH (ref 39.0–52.0)
Hemoglobin: 18 g/dL — ABNORMAL HIGH (ref 13.0–17.0)
O2 Saturation: 93 %
Potassium: 4.2 mmol/L (ref 3.5–5.1)
Sodium: 138 mmol/L (ref 135–145)
TCO2: 26 mmol/L (ref 22–32)
pCO2 arterial: 50.1 mmHg — ABNORMAL HIGH (ref 32–48)
pH, Arterial: 7.302 — ABNORMAL LOW (ref 7.35–7.45)
pO2, Arterial: 75 mmHg — ABNORMAL LOW (ref 83–108)

## 2023-05-26 SURGERY — RIGHT/LEFT HEART CATH AND CORONARY/GRAFT ANGIOGRAPHY
Anesthesia: LOCAL

## 2023-05-26 MED ORDER — ONDANSETRON HCL 4 MG/2ML IJ SOLN: 4.0000 mg | Freq: Four times a day (QID) | INTRAMUSCULAR | Status: DC | PRN

## 2023-05-26 MED ORDER — HEPARIN (PORCINE) IN NACL 1000-0.9 UT/500ML-% IV SOLN
INTRAVENOUS | Status: DC | PRN
  Administered 2023-05-26 (×2): 500 mL

## 2023-05-26 MED ORDER — SODIUM CHLORIDE 0.9 % WEIGHT BASED INFUSION
3.0000 mL/kg/h | INTRAVENOUS | Status: AC
Start: 2023-05-26 — End: 2023-05-26
  Administered 2023-05-26: 3 mL/kg/h via INTRAVENOUS

## 2023-05-26 MED ORDER — FENTANYL CITRATE (PF) 100 MCG/2ML IJ SOLN
INTRAMUSCULAR | Status: DC | PRN
  Administered 2023-05-26: 50 ug via INTRAVENOUS
  Administered 2023-05-26: 25 ug via INTRAVENOUS

## 2023-05-26 MED ORDER — SODIUM CHLORIDE 0.9 % WEIGHT BASED INFUSION
1.0000 mL/kg/h | INTRAVENOUS | Status: DC
Start: 2023-05-26 — End: 2023-05-26

## 2023-05-26 MED ORDER — SODIUM CHLORIDE 0.9 % IV SOLN: INTRAVENOUS | Status: DC

## 2023-05-26 MED ORDER — ACETAMINOPHEN 325 MG PO TABS: 650.0000 mg | ORAL_TABLET | ORAL | Status: DC | PRN

## 2023-05-26 MED ORDER — FENTANYL CITRATE (PF) 100 MCG/2ML IJ SOLN
INTRAMUSCULAR | Status: AC
  Filled 2023-05-26: qty 2

## 2023-05-26 MED ORDER — SODIUM CHLORIDE 0.9% FLUSH: 3.0000 mL | INTRAVENOUS | Status: DC | PRN

## 2023-05-26 MED ORDER — IOHEXOL 350 MG/ML SOLN
INTRAVENOUS | Status: DC | PRN
  Administered 2023-05-26: 86 mL

## 2023-05-26 MED ORDER — SODIUM CHLORIDE 0.9% FLUSH: 3.0000 mL | Freq: Two times a day (BID) | INTRAVENOUS | Status: DC

## 2023-05-26 MED ORDER — MIDAZOLAM HCL 2 MG/2ML IJ SOLN
INTRAMUSCULAR | Status: DC | PRN
  Administered 2023-05-26: 2 mg via INTRAVENOUS
  Administered 2023-05-26: 1 mg via INTRAVENOUS

## 2023-05-26 MED ORDER — VERAPAMIL HCL 2.5 MG/ML IV SOLN
INTRAVENOUS | Status: DC | PRN
  Administered 2023-05-26: 10 mL via INTRA_ARTERIAL

## 2023-05-26 MED ORDER — ASPIRIN 81 MG PO CHEW: 81.0000 mg | CHEWABLE_TABLET | ORAL | Status: DC

## 2023-05-26 MED ORDER — LIDOCAINE HCL (PF) 1 % IJ SOLN
INTRAMUSCULAR | Status: DC | PRN
  Administered 2023-05-26 (×2): 2 mL

## 2023-05-26 MED ORDER — METOPROLOL SUCCINATE ER 25 MG PO TB24: 50.0000 mg | ORAL_TABLET | Freq: Every day | ORAL | 3 refills | Status: AC

## 2023-05-26 MED ORDER — HEPARIN SODIUM (PORCINE) 1000 UNIT/ML IJ SOLN
INTRAMUSCULAR | Status: AC
  Filled 2023-05-26: qty 10

## 2023-05-26 MED ORDER — HYDRALAZINE HCL 20 MG/ML IJ SOLN: 10.0000 mg | INTRAMUSCULAR | Status: DC | PRN

## 2023-05-26 MED ORDER — MIDAZOLAM HCL 2 MG/2ML IJ SOLN
INTRAMUSCULAR | Status: AC
  Filled 2023-05-26: qty 2

## 2023-05-26 MED ORDER — LABETALOL HCL 5 MG/ML IV SOLN: 10.0000 mg | INTRAVENOUS | Status: DC | PRN

## 2023-05-26 MED ORDER — LABETALOL HCL 5 MG/ML IV SOLN
INTRAVENOUS | Status: AC
  Filled 2023-05-26: qty 4

## 2023-05-26 MED ORDER — LABETALOL HCL 5 MG/ML IV SOLN
INTRAVENOUS | Status: DC | PRN
  Administered 2023-05-26 (×2): 10 mg via INTRAVENOUS

## 2023-05-26 MED ORDER — LIDOCAINE HCL (PF) 1 % IJ SOLN
INTRAMUSCULAR | Status: AC
  Filled 2023-05-26: qty 30

## 2023-05-26 MED ORDER — VERAPAMIL HCL 2.5 MG/ML IV SOLN
INTRAVENOUS | Status: AC
  Filled 2023-05-26: qty 2

## 2023-05-26 MED ORDER — VALSARTAN 40 MG PO TABS: 40.0000 mg | ORAL_TABLET | Freq: Every day | ORAL | 11 refills | Status: DC

## 2023-05-26 MED ORDER — SODIUM CHLORIDE 0.9 % IV SOLN: 250.0000 mL | INTRAVENOUS | Status: DC | PRN

## 2023-05-26 SURGICAL SUPPLY — 13 items
CATH BALLN WEDGE 5F 110CM (CATHETERS) IMPLANT
CATH EXPO 5F MPA-1 (CATHETERS) IMPLANT
CATH INFINITI 5FR AL1 (CATHETERS) IMPLANT
CATH INFINITI 5FR MULTPACK ANG (CATHETERS) IMPLANT
DEVICE RAD COMP TR BAND LRG (VASCULAR PRODUCTS) IMPLANT
ELECT DEFIB PAD ADLT CADENCE (PAD) IMPLANT
GLIDESHEATH SLEND SS 6F .021 (SHEATH) IMPLANT
GUIDEWIRE INQWIRE 1.5J.035X260 (WIRE) IMPLANT
INQWIRE 1.5J .035X260CM (WIRE) ×1 IMPLANT
PACK CARDIAC CATHETERIZATION (CUSTOM PROCEDURE TRAY) ×1 IMPLANT
SET ATX-X65L (MISCELLANEOUS) IMPLANT
SHEATH GLIDE SLENDER 4/5FR (SHEATH) IMPLANT
SHEATH PROBE COVER 6X72 (BAG) IMPLANT

## 2023-05-26 NOTE — Discharge Instructions (Signed)

## 2023-05-26 NOTE — Progress Notes (Signed)
 TR band removed at 1300, gauze dressing applied. Left radial level 0, clean, dry, and intact. Patient walked to the bathroom without difficulties.

## 2023-05-26 NOTE — Interval H&P Note (Signed)
 History and Physical Interval Note:  05/26/2023 9:39 AM  Jerry Young.  has presented today for surgery, with the diagnosis of cardiomyopathy.  The various methods of treatment have been discussed with the patient and family. After consideration of risks, benefits and other options for treatment, the patient has consented to  Procedure(s): RIGHT/LEFT HEART CATH AND CORONARY/GRAFT ANGIOGRAPHY (N/A)  PERCUTANEOUS CORONARY INTERVENTION  as a surgical intervention.  The patient's history has been reviewed, patient examined, no change in status, stable for surgery.  I have reviewed the patient's chart and labs.  Questions were answered to the patient's satisfaction.    Cath Lab Visit (complete for each Cath Lab visit)  Clinical Evaluation Leading to the Procedure:   ACS: No.  Non-ACS:    Anginal Classification: CCS II  Anti-ischemic medical therapy: Minimal Therapy (1 class of medications)  Non-Invasive Test Results: Intermediate-risk stress test findings: cardiac mortality 1-3%/year -> new drop in EF with more CHF symptoms than angina  Prior CABG: Previous CABG    Bryan Lemma

## 2023-05-28 ENCOUNTER — Other Ambulatory Visit: Payer: Self-pay | Admitting: Medical Genetics

## 2023-05-29 ENCOUNTER — Other Ambulatory Visit: Payer: Self-pay | Admitting: *Deleted

## 2023-05-29 ENCOUNTER — Encounter (HOSPITAL_COMMUNITY): Payer: Self-pay | Admitting: Cardiology

## 2023-05-29 ENCOUNTER — Other Ambulatory Visit

## 2023-05-29 MED ORDER — PROBENECID 500 MG PO TABS: 500.0000 mg | ORAL_TABLET | Freq: Two times a day (BID) | ORAL | 2 refills | Status: DC

## 2023-05-29 NOTE — Telephone Encounter (Signed)
 Last Fill: 04/20/2023  Labs: 05/22/2023 Creat. 1.32 05/03/2023 RBC 5.98, Hct 51.5, RDW 15.2  Next Visit: 06/29/2023  Last Visit: 05/18/2023  DX:  Idiopathic chronic gout of multiple sites with tophus   Current Dose per office note 05/18/2023: probenecid 500 mg 1 tablet twice daily   Okay to refill Probenecid?

## 2023-05-30 MED FILL — Heparin Sodium (Porcine) Inj 10000 Unit/ML: INTRAMUSCULAR | Qty: 1 | Status: AC

## 2023-06-13 ENCOUNTER — Encounter: Payer: Medicaid Other | Admitting: Rheumatology

## 2023-06-21 ENCOUNTER — Other Ambulatory Visit: Payer: Self-pay | Admitting: Physician Assistant

## 2023-06-21 DIAGNOSIS — M1A09X0 Idiopathic chronic gout, multiple sites, without tophus (tophi): Secondary | ICD-10-CM

## 2023-06-21 NOTE — Telephone Encounter (Signed)
 Last Fill: 03/24/2023  Labs: 05/22/2023 Creat. 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

## 2023-06-22 NOTE — Progress Notes (Unsigned)
 Office Visit Note  Patient: Jerry Young.             Date of Birth: December 16, 1970           MRN: [id number]             PCP: Wellington Half, FNP Referring: Wellington Half, * Visit Date: 06/29/2023 Occupation: @GUAROCC @  Subjective:  Medication monitoring  History of Present Illness: Jerry Young. is a 53 y.o. male with history of gout and osteoarthritis.  Patient is currently taking allopurinol  600 mg daily, colchicine  0.6 mg 1 tablet by mouth daily, and probenecid  500 mg 1 tablet twice daily.  He is tolerating combination therapy without any side effects and has not had any recent gaps in therapy.  He denies any symptoms of a gout flare but has started to have a recurrence of pain involving both knees.  He had a left knee joint cortisone injection performed on 05/18/2023 and a right knee joint cortisone injection performed on 05/23/2023 which provided significant relief but his symptoms have started to recur.  He has difficulty standing or ambulating for prolonged peers of time due to the discomfort.  He has difficulty climbing steps and rising from a seated position.  Activities of Daily Living:  Patient reports morning stiffness for 5-10 minutes.   Patient Denies nocturnal pain.  Difficulty dressing/grooming: Denies Difficulty climbing stairs: Reports Difficulty getting out of chair: Reports Difficulty using hands for taps, buttons, cutlery, and/or writing: Reports  Review of Systems  Constitutional:  Positive for fatigue.  HENT:  Negative for mouth sores and mouth dryness.   Eyes:  Negative for dryness.  Respiratory:  Positive for shortness of breath.   Cardiovascular:  Negative for chest pain and palpitations.  Gastrointestinal:  Negative for blood in stool, constipation and diarrhea.  Endocrine: Negative for increased urination.  Genitourinary:  Negative for involuntary urination.  Musculoskeletal:  Positive for joint pain, joint pain, joint swelling, muscle  weakness, morning stiffness and muscle tenderness. Negative for gait problem, myalgias and myalgias.  Skin:  Negative for color change, rash, hair loss and sensitivity to sunlight.  Allergic/Immunologic: Negative for susceptible to infections.  Neurological:  Negative for dizziness and headaches.  Hematological:  Negative for swollen glands.  Psychiatric/Behavioral:  Positive for sleep disturbance. Negative for depressed mood. The patient is nervous/anxious.     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/28/2023   MDD (major depressive disorder), recurrent episode, mild (HCC) 03/28/2023   Primary hypertension 02/28/2023   Mixed hyperlipidemia 02/28/2023   Chronic gout of multiple sites 02/28/2023   Coronary artery disease 02/28/2023    Past Medical History:  Diagnosis Date   Gout    Hypertension    Myocardial infarction (HCC) 09/24/19   Had triple bypass surgery    Family History  Problem Relation Age of Onset   Arthritis Mother    Arthritis Father    Heart disease Father    Hypertension Father    Kidney disease Father    Arthritis Paternal Aunt    Heart disease Paternal Aunt    Hypertension Paternal Aunt    Arthritis Paternal Uncle    Heart disease Paternal Uncle    Hypertension Paternal Uncle    Arthritis Maternal Grandmother    Arthritis Maternal Grandfather    Cancer Maternal Grandfather    Heart disease Maternal Grandfather    Hypertension Maternal Grandfather  Arthritis Paternal Grandmother    Arthritis Paternal Grandfather    Heart disease Paternal Grandfather    Hypertension Paternal Grandfather    Past Surgical History:  Procedure Laterality Date   CORONARY ARTERY BYPASS GRAFT  09/24/19   Triple bypass   RIGHT/LEFT HEART CATH AND CORONARY/GRAFT ANGIOGRAPHY N/A 05/26/2023   Procedure: RIGHT/LEFT HEART CATH AND CORONARY/GRAFT ANGIOGRAPHY;  Surgeon: Arleen Lacer, MD;   Location: [facility] INVASIVE CV LAB;  Service: Cardiovascular;  Laterality: N/A;   triple bypass  09/23/2020   Social History   Social History Narrative   Not on file    There is no immunization history on file for this patient.   Objective: Vital Signs: BP 135/89 (BP Location: Left Arm, Patient Position: Sitting, Cuff Size: Normal)   Pulse 78   Resp 14   Ht 5' 10.5" (1.791 m)   Wt 239 lb (108.4 kg)   BMI 33.81 kg/m    Physical Exam Vitals and nursing note reviewed.  Constitutional:      Appearance: He is well-developed.  HENT:     Head: Normocephalic and atraumatic.  Eyes:     Conjunctiva/sclera: Conjunctivae normal.     Pupils: Pupils are equal, round, and reactive to light.  Cardiovascular:     Rate and Rhythm: Normal rate and regular rhythm.     Heart sounds: Normal heart sounds.  Pulmonary:     Effort: Pulmonary effort is normal.     Breath sounds: Normal breath sounds.  Abdominal:     General: Bowel sounds are normal.     Palpations: Abdomen is soft.  Musculoskeletal:     Cervical back: Normal range of motion and neck supple.  Skin:    General: Skin is warm and dry.     Capillary Refill: Capillary refill takes less than 2 seconds.  Neurological:     Mental Status: He is alert and oriented to person, place, and time.  Psychiatric:        Behavior: Behavior normal.      Musculoskeletal Exam: C-spine has good range of motion.  Some discomfort and stiffness with range of motion of both shoulders.  Limited extension of both elbow joints.  Limited range of motion of both wrist joints.  Tenderness of the right 2nd and third PIP joints.  Tophi noted on the extensor surface of both elbows and overlying both hands-unchanged.  Hip joints have good ROM with some discomfort bilaterally.  Slightly limited extension of both knees.  No warmth or effusion of knee joints noted.  Ankle joints have good ROM with mild edema.  Tophi on anterior aspect of both knees.    CDAI Exam: CDAI  Score: -- Patient Global: --; Provider Global: -- Swollen: --; Tender: -- Joint Exam 06/29/2023   No joint exam has been documented for this visit   There is currently no information documented on the homunculus. Go to the Rheumatology activity and complete the homunculus joint exam.  Investigation: No additional findings.  Imaging: No results found.   Recent Labs: Lab Results  Component Value Date   WBC 8.3 05/03/2023   HGB 17.7 (H) 05/26/2023   HGB 17.7 (H) 05/26/2023   PLT 201 05/03/2023   NA 139 06/26/2023   K 4.5 06/26/2023   CL 103 06/26/2023   CO2 27 06/26/2023   GLUCOSE 99 06/26/2023   BUN 22 06/26/2023   CREATININE 1.44 (H) 06/26/2023   BILITOT 0.6 06/26/2023   ALKPHOS 122 (H) 02/28/2023   AST 22 06/26/2023  ALT 21 06/26/2023   PROT 6.8 06/26/2023   ALBUMIN 4.2 02/28/2023   CALCIUM  9.3 06/26/2023   GFRAA 54 (L) 07/24/2019    Speciality Comments: No specialty comments available.  Procedures:  No procedures performed Allergies: Patient has no known allergies.    Assessment / Plan:     Visit Diagnoses: Idiopathic chronic gout of multiple sites with tophus - Diagnosed at age 61, family history of gout-paternal grandmother, tophi- elbows, both hands,knees (photos in encounter from 03/24/23), responsive to prednisone , hx of intermittent use of allopurinol :  Previous therapy includes indomethacin, colchicine , allopurinol , and prednisone .  He is not a good candidate for Uloric due to history of MI/triple bypass/hyperlipidemia/hypertension/CAD. Uric acid was 10.4 on 02/28/2023.  Allopurinol  and colchicine  were reinitiated at his initial office visit on 03/24/2023 (started at 150 mg x1wk, 300 mg daily x2 wk, then 450 mg daily as of 04/08/23)--uric acid improved to 7.8 on 04/10/2023.   Probenecid  500 mg 1 tablet twice daily was added on 04/20/23.  Uric acid was rechecked on 05/03/2023: Uric acid was 6.0. The dose of allopurinol  was increased to 600 mg daily starting  05/22/23--uric acid rechecked on 06/26/23: improved to 5.1.   He has been tolerating triple therapy without any side effects and has not had any gaps in therapy.  He has not had any signs or symptoms of a gout flare but he has had a recurrence of pain involving both knees.  Plan to apply for viscosupplementation for both knee joints since he had such temporary relief after having cortisone injections performed bilaterally in April 2025. Patient continues to experience stiffness and limited mobility due to previous joint damage especially in his elbows, hands, and both knees but has no active inflammation at this time. Patient will remain on the current treatment regimen for now.  He will follow-up in the office in 2 months or sooner if needed.  Plan to update CMP with GFR and uric acid level in 2 months.  Medication monitoring encounter - Allopurinol  600 mg daily, colchicine  0.6 mg daily, and probenecid  500 mg 1 tablet twice daily. uric acid: 5.1 on 06/26/23.  Plan to recheck lab work in 2 months. Future orders for CMP with GFR and Uric acid placed today.   Rheumatoid factor positive - RF 26 on 02/28/23-no clinical features of rheumatoid arthritis at this time.  Pain in both hands: History of recurrent gout flares.  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  Erosive changes were noted.  Patient continues to have persistent pain and stiffness involving both hands.  He has difficulty performing ADLs at times due to limited range of motion in his hands.  Primary osteoarthritis of both knees - Tophi noted on the anterior aspect of both knees-unchanged.  X-rays of both knees: moderate osteoarthritis and moderate chondromalacia patella on 04/20/2023. Patient underwent a right knee joint cortisone injection on 05/23/2023 and a left knee joint cortisone injection on 05/18/2023.  He had significant relief after undergoing the  cortisone injections to the point that he had more ease getting up from a chair and climbing steps.  His symptoms have started to recur to the point that he has difficulty walking or standing for even short periods of time.  He has limited extension of both knee joints on examination but no warmth or effusion was noted.  Discussed the option of proceeding with viscosupplementation for both knees.  He was in agreement.  This  patient is diagnosed with osteoarthritis of the knee(s).    Radiographs show evidence of joint space narrowing, osteophytes, subchondral sclerosis and/or subchondral cysts.  This patient has knee pain which interferes with functional and activities of daily living.    This patient has experienced inadequate response, adverse effects and/or intolerance with conservative treatments such as acetaminophen , NSAIDS, topical creams, physical therapy or regular exercise, knee bracing and/or weight loss.   This patient has experienced inadequate response or has a contraindication to intra articular steroid injections for at least 3 months.   This patient is not scheduled to have a total knee replacement within 6 months of starting treatment with viscosupplementation.  Positive ANA (antinuclear antibody) - 02/28/23: ANA 1:40NH, uric acid 10.4, CRP 24.0, ESR 104, RF 26: No clinical features of systemic lupus.  Elevated C-reactive protein (CRP) - CRP was 24 on 02/28/2023.  CRP was within normal limits on 05/22/2023.  Elevated sed rate - ESR was 104 on 03/10/2023.  ESR was within normal limits: 2 on 05/22/2023.  Other medical conditions are listed as follows:  Primary hypertension: Blood pressure was 135/89 today in the office.  Mixed hyperlipidemia: Patient remains on Lipitor as prescribed.  History of MI (myocardial infarction): Not a good candidate for Uloric.  History of coronary artery disease: Under the care of Dr. Katheryne Pane.  Orders: Orders Placed This Encounter  Procedures    Comprehensive metabolic panel with GFR   Uric acid   Meds ordered this encounter  Medications   allopurinol  (ZYLOPRIM ) 300 MG tablet    Sig: Take 2 tablets (600 mg total) by mouth daily.    Dispense:  180 tablet    Refill:  0    Follow-Up Instructions: Return in about 2 months (around 08/29/2023) for Gout, Osteoarthritis.   Romayne Clubs, PA-C  Note - This record has been created using Dragon software.  Chart creation errors have been sought, but may not always  have been located. Such creation errors do not reflect on  the standard of medical care.

## 2023-06-26 ENCOUNTER — Other Ambulatory Visit: Payer: Self-pay | Admitting: *Deleted

## 2023-06-26 ENCOUNTER — Encounter: Payer: Self-pay | Admitting: Cardiovascular Disease

## 2023-06-26 ENCOUNTER — Ambulatory Visit: Attending: Cardiovascular Disease | Admitting: Cardiovascular Disease

## 2023-06-26 VITALS — BP 140/92 | HR 68 | Ht 70.0 in | Wt 235.0 lb

## 2023-06-26 DIAGNOSIS — Z5181 Encounter for therapeutic drug level monitoring: Secondary | ICD-10-CM

## 2023-06-26 DIAGNOSIS — I25810 Atherosclerosis of coronary artery bypass graft(s) without angina pectoris: Secondary | ICD-10-CM | POA: Diagnosis present

## 2023-06-26 DIAGNOSIS — E782 Mixed hyperlipidemia: Secondary | ICD-10-CM | POA: Insufficient documentation

## 2023-06-26 DIAGNOSIS — I10 Essential (primary) hypertension: Secondary | ICD-10-CM | POA: Insufficient documentation

## 2023-06-26 DIAGNOSIS — M1A09X1 Idiopathic chronic gout, multiple sites, with tophus (tophi): Secondary | ICD-10-CM

## 2023-06-26 MED ORDER — VALSARTAN 80 MG PO TABS: 80.0000 mg | ORAL_TABLET | Freq: Every day | ORAL | 3 refills | Status: AC

## 2023-06-26 NOTE — Assessment & Plan Note (Signed)
 History of essential hypertension with blood pressure measured today at 140/92.  He is on metoprolol  and valsartan .  His blood pressure at home is in this range as well.  I am going to increase his valsartan  from 40 to 80 mg a day, and we will check a basic metabolic panel in 2 weeks.  I asked him to keep a blood pressure log for the next 30 days.  This data will be reviewed by a Pharm.D. or APP in 6 or 8 weeks.

## 2023-06-26 NOTE — Assessment & Plan Note (Signed)
 History of hyperlipidemia recently started on atorvastatin .  His last lipid profile prior to that performed 02/28/2023 revealed total cholesterol 212, LDL 131 and HDL 53, not at goal for secondary prevention.  We will recheck a fasting lipid liver profile.

## 2023-06-26 NOTE — Assessment & Plan Note (Signed)
 History of CAD status post bypass grafting at [facility] 09/23/2020.  Because of fatigue, shortness of breath and decline in his EF I arrange for him to undergo outpatient right left heart cath by Dr. Addie Holstein 05/26/23 revealing patent grafts.  His LVEDP was elevated as was his wedge pressure.  Valsartan  was begun.  There is a question whether his decreased LV function was related to "a nonischemic cardiomyopathy".  We will recheck a 2D echo in 3 months.

## 2023-06-26 NOTE — Patient Instructions (Addendum)
 Medication Instructions:  Your physician has recommended you make the following change in your medication:   -Increase valsartan  (diovan ) to 80mg  once daily.  *If you need a refill on your cardiac medications before your next appointment, please call your pharmacy*  Lab Work: Your physician recommends that you return for lab work in: 2 weeks for [employer]  Your physician recommends that you return for lab work in: 2 months for FASTING lipid/liver panel   If you have labs (blood work) drawn today and your tests are completely normal, you will receive your results only by: MyChart Message (if you have MyChart) OR A paper copy in the mail If you have any lab test that is abnormal or we need to change your treatment, we will call you to review the results.  Testing/Procedures: Your physician has requested that you have an echocardiogram. Echocardiography is a painless test that uses sound waves to create images of your heart. It provides your doctor with information about the size and shape of your heart and how well your heart's chambers and valves are working. This procedure takes approximately one hour. There are no restrictions for this procedure. Please do NOT wear cologne, perfume, aftershave, or lotions (deodorant is allowed). Please arrive 15 minutes prior to your appointment time. **To do in August**  Please note: We ask at that you not bring children with you during ultrasound (echo/ vascular) testing. Due to room size and safety concerns, children are not allowed in the ultrasound rooms during exams. Our front office staff cannot provide observation of children in our lobby area while testing is being conducted. An adult accompanying a patient to their appointment will only be allowed in the ultrasound room at the discretion of the ultrasound technician under special circumstances. We apologize for any inconvenience.   Follow-Up: At [facility], you and your health needs are  our priority.  As part of our continuing mission to provide you with exceptional heart care, our providers are all part of one team.  This team includes your primary Cardiologist (physician) and Advanced Practice Providers or APPs (Physician Assistants and Nurse Practitioners) who all work together to provide you with the care you need, when you need it.  Your next appointment:   6 month(s)  Provider:   Lauro Portal, MD     We recommend signing up for the patient portal called "MyChart".  Sign up information is provided on this After Visit Summary.  MyChart is used to connect with patients for Virtual Visits (Telemedicine).  Patients are able to view lab/test results, encounter notes, upcoming appointments, etc.  Non-urgent messages can be sent to your provider as well.   To learn more about what you can do with MyChart, go to [email or web address].   Other Instructions   HOW TO TAKE YOUR BLOOD PRESSURE: Rest 5 minutes before taking your blood pressure. Don't smoke or drink caffeinated beverages for at least 30 minutes before. Take your blood pressure before (not after) you eat. Sit comfortably with your back supported and both feet on the floor (don't cross your legs). Elevate your arm to heart level on a table or a desk. Use the proper sized cuff. It should fit smoothly and snugly around your bare upper arm. There should be enough room to slip a fingertip under the cuff. The bottom edge of the cuff should be 1 inch above the crease of the elbow. Ideally, take 3 measurements at one sitting and record the average.

## 2023-06-26 NOTE — Progress Notes (Signed)
 06/26/2023 Jerry Young.   28-Dec-1970  [id number]  Primary Physician Wellington Half, FNP Primary Cardiologist: Avanell Leigh MD Lathan Poke, [place], [place]  HPI:  Jerry Young. is a 53 y.o.   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  He is referred by Casimer Clear, FNP to be established because of history of CAD.  I last saw him in the office 05/23/23.  His risk factors include treated hypertension and untreated hyperlipidemia.  He is not diabetic.  He is never smoked.  There is no family history of heart disease.  Is never had a stroke.  He has CABG x 3 at [facility] in [place] 09/23/2020.  He did well for 2 years after that but over the last year has noticed increased fatigue and dyspnea.   I obtain a 2D echo on him 05/08/2023 revealed an EF of 40 to 45% with grade 2 diastolic dysfunction and segmental wall motion abnormalities.  This represents a decline in his EF of 50 to 55% by echo that was done at [facility] 07/02/2020.  Based on this, I decided to proceed with outpatient right left heart cath by Dr. Addie Holstein .  Right left heart cath was performed 05/26/23 revealing patent grafts with elevated LVEDP and wedge pressure.  Valsartan  was initiated.  He still complains of some fatigue and dyspnea however.   Current Meds  Medication Sig   acetaminophen  (TYLENOL ) 500 MG tablet Take 1,500-2,000 mg by mouth every 6 (six) hours as needed for moderate pain (pain score 4-6) or mild pain (pain score 1-3). For pain   allopurinol  (ZYLOPRIM ) 300 MG tablet Take 2 tablets (600 mg total) by mouth daily.   atorvastatin  (LIPITOR) 40 MG tablet Take 1 tablet (40 mg total) by mouth daily.   colchicine  0.6 MG tablet TAKE 1 TABLET BY MOUTH EVERY DAY   metoprolol  succinate (TOPROL -XL) 25 MG  24 hr tablet Take 2 tablets (50 mg total) by mouth daily.   probenecid  (BENEMID ) 500 MG tablet Take 1 tablet (500 mg total) by mouth 2 (two) times daily.   valsartan  (DIOVAN ) 40 MG tablet Take 1 tablet (40 mg total) by mouth daily.     No Known Allergies  Social History   Socioeconomic History   Marital status: Divorced    Spouse name: Not on file   Number of children: Not on file   Years of education: Not on file   Highest education level: Some college, no degree  Occupational History   Not on file  Tobacco Use   Smoking status: Never    Passive exposure: Past   Smokeless tobacco: Current    Types: Chew   Tobacco comments:    I use chewing tobacco  Vaping Use   Vaping status: Never Used  Substance and Sexual Activity   Alcohol use: Yes    Comment: Varies but not often   Drug use: No   Sexual activity: Not Currently    Birth control/protection: None  Other Topics Concern   Not on file  Social History Narrative   Not on file   Social Drivers of Health   Financial Resource Strain: High Risk (02/04/2023)   Overall Financial Resource Strain (CARDIA)    Difficulty of Paying Living Expenses: Hard  Food Insecurity: Food Insecurity Present (02/04/2023)   Hunger Vital  Sign    Worried About Programme researcher, broadcasting/film/video in the Last Year: Sometimes true    Ran Out of Food in the Last Year: Never true  Transportation Needs: No Transportation Needs (02/04/2023)   PRAPARE - Administrator, Civil Service (Medical): No    Lack of Transportation (Non-Medical): No  Physical Activity: Insufficiently Active (02/04/2023)   Exercise Vital Sign    Days of Exercise per Week: 1 day    Minutes of Exercise per Session: 20 min  Stress: Stress Concern Present (02/04/2023)   [employer] of Occupational Health - Occupational Stress Questionnaire    Feeling of Stress : Rather much  Social Connections: Socially Isolated (02/04/2023)   Social Connection and Isolation Panel [NHANES]     Frequency of Communication with Friends and Family: Three times a week    Frequency of Social Gatherings with Friends and Family: Once a week    Attends Religious Services: Never    Database administrator or Organizations: No    Attends Engineer, structural: Not on file    Marital Status: Divorced  Intimate Partner Violence: Unknown (05/21/2021)   Received from [employer], [facility]   HITS    Physically Hurt: Not on file    Insult or Talk Down To: Not on file    Threaten Physical Harm: Not on file    Scream or Curse: Not on file     Review of Systems: General: negative for chills, fever, night sweats or weight changes.  Cardiovascular: negative for chest pain, dyspnea on exertion, edema, orthopnea, palpitations, paroxysmal nocturnal dyspnea or shortness of breath Dermatological: negative for rash Respiratory: negative for cough or wheezing Urologic: negative for hematuria Abdominal: negative for nausea, vomiting, diarrhea, bright red blood per rectum, melena, or hematemesis Neurologic: negative for visual changes, syncope, or dizziness All other systems reviewed and are otherwise negative except as noted above.    Blood pressure (!) 140/92, pulse 68, height 5\' 10"  (1.778 m), weight 235 lb (106.6 kg), SpO2 96%.  General appearance: alert and no distress Neck: no adenopathy, no carotid bruit, no JVD, supple, symmetrical, trachea midline, and thyroid not enlarged, symmetric, no tenderness/mass/nodules Lungs: clear to auscultation bilaterally Heart: regular rate and rhythm, S1, S2 normal, no murmur, click, rub or gallop Extremities: extremities normal, atraumatic, no cyanosis or edema Pulses: 2+ and symmetric Skin: Skin color, texture, turgor normal. No rashes or lesions Neurologic: Grossly normal  EKG not performed today      ASSESSMENT AND PLAN:   Primary hypertension History of essential hypertension with blood pressure measured today at 140/92.  He is on  metoprolol  and valsartan .  His blood pressure at home is in this range as well.  I am going to increase his valsartan  from 40 to 80 mg a day, and we will check a basic metabolic panel in 2 weeks.  I asked him to keep a blood pressure log for the next 30 days.  This data will be reviewed by a Pharm.D. or APP in 6 or 8 weeks.  Mixed hyperlipidemia History of hyperlipidemia recently started on atorvastatin .  His last lipid profile prior to that performed 02/28/2023 revealed total cholesterol 212, LDL 131 and HDL 53, not at goal for secondary prevention.  We will recheck a fasting lipid liver profile.  Coronary artery disease History of CAD status post bypass grafting at [facility] 09/23/2020.  Because of fatigue, shortness of breath and decline in his EF I  arrange for him to undergo outpatient right left heart cath by Dr. Addie Holstein 05/26/23 revealing patent grafts.  His LVEDP was elevated as was his wedge pressure.  Valsartan  was begun.  There is a question whether his decreased LV function was related to "a nonischemic cardiomyopathy".  We will recheck a 2D echo in 3 months.     Avanell Leigh MD FACP,FACC,FAHA, [facility] 06/26/2023 8:23 AM

## 2023-06-27 ENCOUNTER — Ambulatory Visit: Payer: Self-pay | Admitting: Physician Assistant

## 2023-06-27 LAB — COMPREHENSIVE METABOLIC PANEL WITH GFR
AG Ratio: 1.8 (calc) (ref 1.0–2.5)
ALT: 21 U/L (ref 9–46)
AST: 22 U/L (ref 10–35)
Albumin: 4.4 g/dL (ref 3.6–5.1)
Alkaline phosphatase (APISO): 122 U/L (ref 35–144)
BUN/Creatinine Ratio: 15 (calc) (ref 6–22)
BUN: 22 mg/dL (ref 7–25)
CO2: 27 mmol/L (ref 20–32)
Calcium: 9.3 mg/dL (ref 8.6–10.3)
Chloride: 103 mmol/L (ref 98–110)
Creat: 1.44 mg/dL — ABNORMAL HIGH (ref 0.70–1.30)
Globulin: 2.4 g/dL (ref 1.9–3.7)
Glucose, Bld: 99 mg/dL (ref 65–99)
Potassium: 4.5 mmol/L (ref 3.5–5.3)
Sodium: 139 mmol/L (ref 135–146)
Total Bilirubin: 0.6 mg/dL (ref 0.2–1.2)
Total Protein: 6.8 g/dL (ref 6.1–8.1)
eGFR: 57 mL/min/{1.73_m2} — ABNORMAL LOW (ref >=60)

## 2023-06-27 LAB — URIC ACID: Uric Acid, Serum: 5.1 mg/dL (ref 4.0–8.0)

## 2023-06-27 NOTE — Progress Notes (Signed)
 Uric acid level continues to improve/trend down--5.1.  Creatinine remains elevated-1.44 and GFR is low-57. Rest of CMP WNL.  Avoid NSAID use and increase water intake.

## 2023-06-29 ENCOUNTER — Telehealth: Payer: Self-pay

## 2023-06-29 ENCOUNTER — Encounter: Payer: Self-pay | Admitting: Physician Assistant

## 2023-06-29 ENCOUNTER — Ambulatory Visit: Attending: Physician Assistant | Admitting: Physician Assistant

## 2023-06-29 VITALS — BP 135/89 | HR 78 | Resp 14 | Ht 70.5 in | Wt 239.0 lb

## 2023-06-29 DIAGNOSIS — R768 Other specified abnormal immunological findings in serum: Secondary | ICD-10-CM | POA: Insufficient documentation

## 2023-06-29 DIAGNOSIS — E782 Mixed hyperlipidemia: Secondary | ICD-10-CM | POA: Diagnosis present

## 2023-06-29 DIAGNOSIS — I252 Old myocardial infarction: Secondary | ICD-10-CM | POA: Insufficient documentation

## 2023-06-29 DIAGNOSIS — R700 Elevated erythrocyte sedimentation rate: Secondary | ICD-10-CM | POA: Diagnosis present

## 2023-06-29 DIAGNOSIS — M79642 Pain in left hand: Secondary | ICD-10-CM | POA: Diagnosis present

## 2023-06-29 DIAGNOSIS — M170 Bilateral primary osteoarthritis of knee: Secondary | ICD-10-CM | POA: Insufficient documentation

## 2023-06-29 DIAGNOSIS — M79641 Pain in right hand: Secondary | ICD-10-CM | POA: Insufficient documentation

## 2023-06-29 DIAGNOSIS — Z8679 Personal history of other diseases of the circulatory system: Secondary | ICD-10-CM | POA: Insufficient documentation

## 2023-06-29 DIAGNOSIS — Z5181 Encounter for therapeutic drug level monitoring: Secondary | ICD-10-CM | POA: Diagnosis present

## 2023-06-29 DIAGNOSIS — I10 Essential (primary) hypertension: Secondary | ICD-10-CM | POA: Insufficient documentation

## 2023-06-29 DIAGNOSIS — R7982 Elevated C-reactive protein (CRP): Secondary | ICD-10-CM | POA: Insufficient documentation

## 2023-06-29 DIAGNOSIS — M1A09X1 Idiopathic chronic gout, multiple sites, with tophus (tophi): Secondary | ICD-10-CM | POA: Diagnosis present

## 2023-06-29 MED ORDER — ALLOPURINOL 300 MG PO TABS
600.0000 mg | ORAL_TABLET | Freq: Every day | ORAL | 0 refills | Status: DC
Start: 2023-06-29 — End: 2023-10-02

## 2023-06-29 NOTE — Telephone Encounter (Signed)
 Patient's insurance plan ([employer]) does not cover viscosupplementation.

## 2023-06-29 NOTE — Telephone Encounter (Signed)
 Please apply for bilateral knee visco, per Sherron Ales, PA-C. Thanks!

## 2023-06-30 NOTE — Telephone Encounter (Signed)
 Ok please notify the patient.   Ok to repeat cortisone injections in July 2025.

## 2023-07-03 NOTE — Telephone Encounter (Signed)
 Advised patient that his insurance does not cover viscosupplementation. Advised that per Jacinta Martinis, PA-C, Ok to repeat cortisone injections in July 2025. Patient verbalized understanding. Patient is scheduled to follow up on 08/24/2023. I have noted that appointment for a cortisone knee injection as well.

## 2023-07-13 ENCOUNTER — Ambulatory Visit: Payer: Medicaid Other | Admitting: Rheumatology

## 2023-07-19 ENCOUNTER — Other Ambulatory Visit: Payer: Self-pay | Admitting: Physician Assistant

## 2023-07-19 DIAGNOSIS — M1A09X1 Idiopathic chronic gout, multiple sites, with tophus (tophi): Secondary | ICD-10-CM

## 2023-07-28 NOTE — Progress Notes (Deleted)
 Cardiology Office Note:    Date:  07/28/2023   ID:  Jerry Young., DOB 21-May-1970, MRN [id number]  PCP:  Jerry Half, FNP  Cardiologist:  Jerry Portal, MD { Click to update primary MD,subspecialty MD or APP then REFRESH:1}    Referring MD: Jerry Young, *   Chief Complaint: follow-up of CAD and CHF  History of Present Illness:    Jerry Young. is a 53 y.o. 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 Katheryne Young and presents today for routine follow-up.  Patient was previously followed by Cardiology at [facility], [place]. Echo in 08/2019 showed LVEF of 45-50% with mild LVH and inferior akinesis and inferoseptal and inferolateral hypokinesis. This led to a cardiac catheterization in 09/2019 showed severe 3 vessel CAD. He underwent CABG x3 with LIMA to LAD, SVG to PAD, and SVG to ramus intermediate in 09/2019. He had a brief episode of post-op atrial fibrillation that resolved with Amiodarone which was subsequently discontinued. He did not require anticoagulation. Repeat Echo in 06/2020 showed LVEF of 50-55% with mild LVH, inferior akinesis, and inferolateral hypokinesis as well as mild MR.  He was referred to Dr. Katheryne Young in 04/2023 to establish care at which time he reported increased dyspnea and fatigue over the last year. Echo was ordered for further evaluation and showed LVEF of 40-45% with severe hypokinesis of the basal-mid inferior wall and inferolateral wall and grade 2 diastolic dysfunction. This led to a R/ LHC in 05/2023 which showed severe native CAD but 3/3 patent grafts, elevated LVEDP of 21 mmHg, and mild pulmonary hypertension (WHO group II). He was started on Valsartan .  He was last seen by Dr. Katheryne Young on 06/26/2023 at which time he  continue to report dyspnea and fatigue. Plan was for repeat Echo in 3 months.   Patient presents today for follow-up. ***  CAD s/p CABG S/p CABG x3 in 09/2020. Recent LHC in 05/2023 showed severe native CAD but 3/3 patent grafts.  - No chest pain.  - She is not currently on Aspirin . Will start Aspirin  81mg  daily. *** - Continue Lipitor 40mg  daily.   Chronic HFmrEF Mixed Ischemic/ Non-Ischemic Cardiomyopathy Initially diagnosed with CHF with EF of 45-50% and wall motion abnormalities in 08/2019. EF improved to 50-55% after CABG. However, most recent Echo in 04/2023 showed drop in EF to 40-45% with severe hypokinesis of the basal-mid inferior wall and inferolateral wall and grade 2 diastolic dysfunction. 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 - Euvolemic on exam. *** - Continue Valsartan  80mg  daily.  - Continue Toprol -XL 50mg  daily.  - Spironolactone or SLGT2 inhibitor *** - Discussed importance of daily weights and sodium/ fluid restrictions. ***  Post-Op Atrial Fibrillation Following CABG. He was briefly on Amiodarone but never required anticoagulation. No documented recurrence since then.  - Maintaining sinus rhythm on exam. *** - Continue Toprol -XL 50mg  daily.  - CHA2DS2-VASC = 3 (CAD, CHF, HTN). Not on anticoagulation given no recurrence. However, if he has any documented recurrence, would recommend starting Eliquis.   Hypertension BP *** - Continue GDMT for CHF as above.  Hyperlipidemia Lipid panel in 02/2023: Total Cholesterol 212, Triglycerides 135, HDL53.8, LDL 131. LDL goal *** - He was restarted on Lipitor 40mg  daily in  04/2023. Continue.  - He is due for repeat labs. ***   EKGs/Labs/Other Studies Reviewed:    The following studies were reviewed:  Echocardiogram 05/08/2023: Impressions: 1. Left ventricular ejection fraction, by estimation, is 40 to 45%. The  left ventricle has mildly decreased function.  The left ventricle  demonstrates regional wall motion abnormalities (see scoring  diagram/findings for description). Left ventricular  diastolic parameters are consistent with Grade II diastolic dysfunction  (pseudonormalization). Elevated left atrial pressure. There is severe  hypokinesis of the left ventricular, basal-mid inferior wall and  inferolateral wall. The average left  ventricular global longitudinal strain is -15.3 %. The global longitudinal  strain is abnormal.   2. Right ventricular systolic function is normal. The right ventricular  size is normal.   3. The mitral valve is normal in structure. Trivial mitral valve  regurgitation. No evidence of mitral stenosis.   4. The aortic valve is tricuspid. There is mild thickening of the aortic  valve. Aortic valve regurgitation is not visualized. Aortic valve  sclerosis is present, with no evidence of aortic valve stenosis.   5. The inferior vena cava is normal in size with greater than 50%  respiratory variability, suggesting right atrial pressure of 3 mmHg.   Comparison(s): Prior images unable to be directly viewed, comparison made  by report only. The left ventricular function is worsened. The left  ventricular wall motion abnormalities are similar to those described on  echo Jul 02, 2020 at [facility]. Previous estimated EF was 50-55%.  _______________  Right/ Left Cardiac Catheterization 05/26/2023:  ---Severe native CAD with CTO of RCA, LAD, OM 2, and small D2 with severe proximal LCx disease prior to occluded OM 2---   Prox LAD to Mid LAD lesion is 60% stenosed with 80% stenosed side branch in 1st Diag.  Mid LAD lesion is 100% stenosed.  2nd Diag lesion is 100% stenosed.   Prox Cx to Mid Cx lesion is 90% stenosed.  2nd Mrg lesion is 100% stenosed.   Ost RCA lesion is 60% stenosed.  Prox RCA lesion is 85% stenosed.  Mid RCA to Dist RCA lesion is 100% stenosed. ------------------3 of 3 patent grafts------------------------    LIMA-LAD graft was visualized by angiography and is normal in caliber.  The graft exhibits no disease.   SVG-OM 2 graft was visualized by angiography and is normal in caliber.  The graft exhibits no disease.   SVG-RPDA graft was visualized by angiography and is normal in caliber.  The graft exhibits no disease.  ----------------- Hemodynamics -------------------------   Hemodynamic findings consistent with mild pulmonary hypertension.  (WHO class II)   There is no aortic valve stenosis.   Dominance: Right  Severe Native CAD With 3 of 3 Patent Grafts:  LAD: 60% proximal LAD with 80% D1 (small caliber), then the LAD is 100% CTO in the mid vessel with patent LIMA to LAD LCx: 90% proximal LCx with 100 % CTO OM2 (called Ramus on CABG Report)  with patent SVG-OM 2 RCA: Ostial RCA 60%, proximal RCA 80% followed by 100% CTO with mid vessel with widely patent SVG-PDA. The PDA is mildly diseased but no significant stenosis.  Retrograde filling fills the RPL system and the distal portion of the RCA. Hemodynamics:  LV P-EDP 166/10-23 mmHg; AoP 169/101-127 mmHg RHC:  RAP mean 12 mmHg; RV P-EDP 34/1 1-7 mmHg 7; PAP-mean 40/20-26 mmHg, PCWP mean 21 mmHg; mild WHO class II pulmonary pretension AO sat 93%, PA sat 64% (Fick Cardiac Output-Index 4.5-2.0)  mild to moderately reduced Significant ectopy with frequent PVCs along with systemic hypertension   Recommendations: In the absence of any other complications or medical issues, we expect the patient to be ready for discharge from a cath perspective on 05/26/2023. Suspect that the drop in EF is not due to new ischemia with 3 of 3 grafts patent Initiate GDMT: Add valsartan  40 g daily and titrate Toprol  to 50 mg daily (goal be to reduce PVC burden) Consider evaluation with CMRI to assess for nonischemic etiology Would recommend aspirin , but apparently there is an interaction with probenecid .  Reviewed with cardiac pharmacy team  EKG:  EKG not ordered today.    Recent Labs: 05/03/2023: Platelets 201 05/26/2023: Hemoglobin 17.7; Hemoglobin 17.7 06/26/2023: ALT 21; BUN 22; Creat 1.44; Potassium 4.5; Sodium 139  Recent Lipid Panel    Component Value Date/Time   CHOL 212 (H) 02/28/2023 1409   TRIG 135.0 02/28/2023 1409   HDL 53.80 02/28/2023 1409   CHOLHDL 4 02/28/2023 1409   VLDL 27.0 02/28/2023 1409   LDLCALC 131 (H) 02/28/2023 1409    Physical Exam:    Vital Signs: There were no vitals taken for this visit.    Wt Readings from Last 3 Encounters:  06/29/23 239 lb (108.4 kg)  06/26/23 235 lb (106.6 kg)  05/26/23 235 lb (106.6 kg)     General: 53 y.o. male in no acute distress. HEENT: Normocephalic and atraumatic. Sclera clear.  Neck: Supple. No carotid bruits. No JVD. Heart: *** RRR. Distinct S1 and S2. No murmurs, gallops, or rubs.  Lungs: No increased work of breathing. Clear to ausculation bilaterally. No wheezes, rhonchi, or rales.  Abdomen: Soft, non-distended, and non-tender to palpation.  Extremities: No lower extremity edema.  Radial and distal pedal pulses 2+ and equal bilaterally. Skin: Warm and dry. Neuro: No focal deficits. Psych: Normal affect. Responds appropriately.   Assessment:    No diagnosis found.  Plan:     Disposition: Follow up in ***   Signed, Casimer Clear, PA-C  07/28/2023 10:59 PM    [facility]

## 2023-08-01 ENCOUNTER — Ambulatory Visit: Attending: Student | Admitting: Student

## 2023-08-02 ENCOUNTER — Encounter: Payer: Self-pay | Admitting: Student

## 2023-08-10 NOTE — Progress Notes (Signed)
 Office Visit Note  Patient: Jerry Young.             Date of Birth: 1970/11/04           MRN: [id number]             PCP: Alvia Corean CROME, FNP Referring: Alvia Corean CROME, * Visit Date: 08/24/2023 Occupation: @GUAROCC @  Subjective:  Pain in joints  History of Present Illness: Jerry Cavanah. is a 53 y.o. 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  He returns today after his last visit in May 2025.  He states he has been tolerating allopurinol  600 mg daily without any problems.  He has been taking colchicine  0.6 mg p.o. daily, probenecid  500 mg twice daily.  He states he continues to have's some discomfort in his right shoulder, both hands and his right knee.  Right knee is more painful than the left knee.  He has not noticed any joint swelling.  Diagnosed at age 32, family history of gout-paternal grandmother, tophi- elbows, both hands,knees (photos in encounter from 03/24/23), responsive to prednisone , hx of intermittent use of allopurinol :  Previous therapy includes indomethacin, colchicine , allopurinol , and prednisone .  He is not a good candidate for Uloric due to history of MI/triple bypass/hyperlipidemia/hypertension/CAD. Uric acid was 10.4 on 02/28/2023.  Allopurinol  and colchicine  were reinitiated at his initial office visit on 03/24/2023 (started at 150 mg x1wk, 300 mg daily x2 wk, then 450 mg daily as of 04/08/23)--uric acid improved to 7.8 on 04/10/2023.   Probenecid  500 mg 1 tablet twice daily was added on 04/20/23.  Uric acid was rechecked on 05/03/2023: Uric acid was 6.0. The dose of allopurinol  was increased to 600 mg daily starting 05/22/23--uric acid rechecked on 06/26/23: improved to 5.1.   He has been tolerating triple therapy without any side effects and has not had any gaps in therapy.  He has not had any signs or symptoms of a gout flare but he has had a recurrence of pain involving both knees.  Plan to apply for viscosupplementation for both knee joints since he had  such temporary relief after having cortisone injections performed bilaterally in April 2025.   Activities of Daily Living:  Patient reports morning stiffness for 3-5 minutes.   Patient Denies nocturnal pain.  Difficulty dressing/grooming: Denies Difficulty climbing stairs: Reports Difficulty getting out of chair: Reports Difficulty using hands for taps, buttons, cutlery, and/or writing: Reports  Review of Systems  Constitutional:  Positive for fatigue.  HENT:  Negative for mouth sores and mouth dryness.   Eyes:  Negative for dryness.  Respiratory:  Negative for difficulty breathing.   Cardiovascular:  Negative for chest pain and palpitations.  Gastrointestinal:  Negative for blood in stool, constipation and diarrhea.  Endocrine: Negative for increased urination.  Genitourinary:  Negative for involuntary urination.  Musculoskeletal:  Positive for joint pain, joint pain, joint swelling, myalgias, muscle weakness, morning stiffness, muscle tenderness and myalgias. Negative for gait problem.  Skin:  Positive for sensitivity to sunlight. Negative for color change, rash and hair loss.  Allergic/Immunologic: Negative for susceptible to infections.  Neurological:  Negative for dizziness and headaches.  Hematological:  Negative for swollen glands.  Psychiatric/Behavioral:  Positive for depressed mood and sleep disturbance. The patient is nervous/anxious.     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/28/2023   MDD (major depressive disorder), recurrent episode, mild (HCC) 03/28/2023   Primary hypertension 02/28/2023  Mixed hyperlipidemia 02/28/2023   Chronic gout of multiple sites 02/28/2023   Coronary artery disease 02/28/2023    Past Medical History:  Diagnosis Date   Gout    Hypertension    Myocardial infarction (HCC) 09/24/19   Had triple bypass surgery    Family History  Problem Relation Age of  Onset   Arthritis Mother    Arthritis Father    Heart disease Father    Hypertension Father    Kidney disease Father    Arthritis Paternal Aunt    Heart disease Paternal Aunt    Hypertension Paternal Aunt    Arthritis Paternal Uncle    Heart disease Paternal Uncle    Hypertension Paternal Uncle    Arthritis Maternal Grandmother    Arthritis Maternal Grandfather    Cancer Maternal Grandfather    Heart disease Maternal Grandfather    Hypertension Maternal Grandfather    Arthritis Paternal Grandmother    Arthritis Paternal Grandfather    Heart disease Paternal Grandfather    Hypertension Paternal Grandfather    Past Surgical History:  Procedure Laterality Date   CORONARY ARTERY BYPASS GRAFT  09/24/19   Triple bypass   RIGHT/LEFT HEART CATH AND CORONARY/GRAFT ANGIOGRAPHY N/A 05/26/2023   Procedure: RIGHT/LEFT HEART CATH AND CORONARY/GRAFT ANGIOGRAPHY;  Surgeon: Anner Alm ORN, MD;  Location: [facility];  Service: Cardiovascular;  Laterality: N/A;   triple bypass  09/23/2020   Social History   Social History Narrative   Not on file    There is no immunization history on file for this patient.   Objective: Vital Signs: BP (!) 146/91 (BP Location: Left Arm, Patient Position: Sitting, Cuff Size: Normal)   Pulse 87   Resp 14   Ht 5' 10.5 (1.791 m)   Wt 243 lb (110.2 kg)   BMI 34.37 kg/m    Physical Exam Vitals and nursing note reviewed.  Constitutional:      Appearance: He is well-developed.  HENT:     Head: Normocephalic and atraumatic.  Eyes:     Conjunctiva/sclera: Conjunctivae normal.     Pupils: Pupils are equal, round, and reactive to light.  Cardiovascular:     Rate and Rhythm: Normal rate and regular rhythm.     Heart sounds: Normal heart sounds.  Pulmonary:     Effort: Pulmonary effort is normal.     Breath sounds: Normal breath sounds.  Abdominal:     General: Bowel sounds are normal.     Palpations: Abdomen is soft.  Musculoskeletal:      Cervical back: Normal range of motion and neck supple.  Skin:    General: Skin is warm and dry.     Capillary Refill: Capillary refill takes less than 2 seconds.  Neurological:     Mental Status: He is alert and oriented to person, place, and time.  Psychiatric:        Behavior: Behavior normal.      Musculoskeletal Exam: He good range of motion of the cervical spine.  Lumbar spine was difficult to assess due to body habitus.  He had discomfort with range of motion of his right shoulder joint.  Left elbow joint was in good range of motion.  Elbow joints were in good range of motion with large tophi present over bilateral elbow.  He had limited range of motion of his wrist joints without any swelling.  He had no MCP tenderness or swelling.  There was no tenderness over PIP or DIP joints.  Tophi  were noted over the PIP joints.  Hip joints were in good range of motion without discomfort.  He had limited extension of his knee joints with prepatellar tophi present over bilateral knee joints.  Ankle joints in good range of motion.  He had no tenderness over MTPs.  CDAI Exam: CDAI Score: -- Patient Global: --; Provider Global: -- Swollen: --; Tender: -- Joint Exam 08/24/2023   No joint exam has been documented for this visit   There is currently no information documented on the homunculus. Go to the Rheumatology activity and complete the homunculus joint exam.  Investigation: No additional findings.  Imaging: XR Shoulder Right Result Date: 08/24/2023 No glenohumeral joint space narrowing was noted.  No acromial clavicular joint space narrowing was noted.  Mild spurring was noted. Impression: Unremarkable x-rays of the shoulder.   Recent Labs: Lab Results  Component Value Date   WBC 8.3 05/03/2023   HGB 17.7 (H) 05/26/2023   HGB 17.7 (H) 05/26/2023   PLT 201 05/03/2023   NA 140 08/15/2023   K 4.3 08/15/2023   CL 104 08/15/2023   CO2 25 08/15/2023   GLUCOSE 102 (H) 08/15/2023   BUN  20 08/15/2023   CREATININE 1.16 08/15/2023   BILITOT 0.6 08/15/2023   ALKPHOS 122 (H) 02/28/2023   AST 22 08/15/2023   ALT 23 08/15/2023   PROT 6.3 08/15/2023   ALBUMIN 4.2 02/28/2023   CALCIUM  9.7 08/15/2023   GFRAA 54 (L) 07/24/2019    Speciality Comments: No specialty comments available.  Procedures:  Large Joint Inj: R glenohumeral on 08/24/2023 2:45 PM Details: 27 G 1.5 in needle, posterior approach  Arthrogram: No  Medications: 1.5 mL lidocaine  1 %; 40 mg triamcinolone  acetonide 40 MG/ML Aspirate: 0 mL Outcome: tolerated well, no immediate complications  Risk of infection, tendon injury, nerve injury, hypopigmentation and dermal atrophy were discussed. Procedure, treatment alternatives, risks and benefits explained, specific risks discussed. Consent was given by the patient. Immediately prior to procedure a time out was called to verify the correct patient, procedure, equipment, support staff and site/side marked as required. Patient was prepped and draped in the usual sterile fashion.     Allergies: Patient has no known allergies.   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  He has done well since he has been coming to our office on a consistent basis.  The allopurinol  dose was gradually increased from February 2025 and currently on 600 mg p.o. daily.  He is also on colchicine  0.6 mg daily.  Probenecid  500 mg p.o. twice daily was added in March 2025.  He had a significant drop in his uric acid levels.  Patient states he still consumes red meat but he has been avoiding shellfish.  He does not drink alcohol.  He was advised to cut back and possibly stop red meat.  He has not had any gout flares since the last visit.  He has been having increased pain and discomfort in his right shoulder for the last few months.  He continues to have stiffness and discomfort in  multiple joints including his hands and his knee joints.  Uric acid was 4.6 on August 15, 2023.  Medication monitoring encounter - Allopurinol  600 mg daily, colchicine  0.6 mg daily, and probenecid  500 mg 1 tablet twice daily.  CBC was normal and March 2025.  CMP was normal in July 2025.  Chronic right  shoulder pain -he has been having increased pain and discomfort in his right shoulder joint.  He had painful range of motion of his right shoulder joint with abduction and internal rotation.  Plan: XR Shoulder Right.  X-rays of the shoulder joint were unremarkable.  He has been having discomfort for several months now.  I discussed the option of cortisone injection.  Patient wanted to proceed with the cortisone injection.  Side effects were discussed.  Right glenohumeral joint was injected as described above.  Patient tolerated the procedure well.  Postprocedure instructions given.  A handout on exercise was given.  Rheumatoid factor positive - RF 26 on 02/28/23-no clinical features of rheumatoid arthritis at this time.  No synovitis was noted on the examination.  Pain in both hands-he continues to have stiffness in his hands.  No synovitis was noted on the examination.  I reviewed the initial x-rays with the patient.  He has severe erosive disease.  Tophi were noted over his PIP joints.  Primary osteoarthritis of both knees -he complains of ongoing pain and stiffness in his knee joints.  His right knee joint is more painful.  No warmth swelling or effusion was noted.  I reviewed the x-rays with the patient today.  He has moderate osteoarthritis.  Tophi noted on the anterior aspect of both knees-unchanged.  X-rays of both knees: moderate osteoarthritis and moderate chondromalacia patella on 04/20/2023.  He had cortisone injection to his bilateral knee joints on May 23, 2023.  We may consider repeating injections in the future.  Positive ANA (antinuclear antibody) - 02/28/23: ANA 1:40NH, uric acid 10.4, CRP 24.0,  ESR 104, RF 26: No clinical features of systemic lupus.  Elevated C-reactive protein (CRP)  Elevated sed rate  Primary hypertension-blood pressure remains elevated at 146/83.  He was advised to monitor blood pressure closely and follow-up with his PCP.  Mixed hyperlipidemia-LDL was 131 on February 28, 2023.  History of MI (myocardial infarction)  History of coronary artery disease   Orders: Orders Placed This Encounter  Procedures   Large Joint Inj   XR Shoulder Right   CBC with Differential/Platelet   Comprehensive metabolic panel with GFR   Uric acid   No orders of the defined types were placed in this encounter.    Follow-Up Instructions: Return in about 3 months (around 11/24/2023) for Gout.   Maya Nash, MD  Note - This record has been created using Animal nutritionist.  Chart creation errors have been sought, but may not always  have been located. Such creation errors do not reflect on  the standard of medical care.

## 2023-08-15 ENCOUNTER — Other Ambulatory Visit: Payer: Self-pay | Admitting: *Deleted

## 2023-08-15 DIAGNOSIS — Z5181 Encounter for therapeutic drug level monitoring: Secondary | ICD-10-CM

## 2023-08-15 DIAGNOSIS — M1A09X1 Idiopathic chronic gout, multiple sites, with tophus (tophi): Secondary | ICD-10-CM

## 2023-08-16 ENCOUNTER — Ambulatory Visit: Payer: Self-pay | Admitting: Physician Assistant

## 2023-08-16 LAB — COMPREHENSIVE METABOLIC PANEL WITH GFR
AG Ratio: 2 (calc) (ref 1.0–2.5)
ALT: 23 U/L (ref 9–46)
AST: 22 U/L (ref 10–35)
Albumin: 4.2 g/dL (ref 3.6–5.1)
Alkaline phosphatase (APISO): 126 U/L (ref 35–144)
BUN: 20 mg/dL (ref 7–25)
CO2: 25 mmol/L (ref 20–32)
Calcium: 9.7 mg/dL (ref 8.6–10.3)
Chloride: 104 mmol/L (ref 98–110)
Creat: 1.16 mg/dL (ref 0.70–1.30)
Globulin: 2.1 g/dL (ref 1.9–3.7)
Glucose, Bld: 102 mg/dL — ABNORMAL HIGH (ref 65–99)
Potassium: 4.3 mmol/L (ref 3.5–5.3)
Sodium: 140 mmol/L (ref 135–146)
Total Bilirubin: 0.6 mg/dL (ref 0.2–1.2)
Total Protein: 6.3 g/dL (ref 6.1–8.1)
eGFR: 74 mL/min/{1.73_m2} (ref >=60)

## 2023-08-16 LAB — URIC ACID: Uric Acid, Serum: 4.6 mg/dL (ref 4.0–8.0)

## 2023-08-16 NOTE — Progress Notes (Signed)
 CMP WNL--renal function has improved significantly.   Uric acid is within the desirable range--4.6--great news! Continue current treatment regimen.

## 2023-08-24 ENCOUNTER — Ambulatory Visit

## 2023-08-24 ENCOUNTER — Encounter: Payer: Self-pay | Admitting: Rheumatology

## 2023-08-24 ENCOUNTER — Ambulatory Visit: Attending: Rheumatology | Admitting: Rheumatology

## 2023-08-24 VITALS — BP 146/91 | HR 87 | Resp 14 | Ht 70.5 in | Wt 243.0 lb

## 2023-08-24 DIAGNOSIS — M1A09X1 Idiopathic chronic gout, multiple sites, with tophus (tophi): Secondary | ICD-10-CM | POA: Insufficient documentation

## 2023-08-24 DIAGNOSIS — M25511 Pain in right shoulder: Secondary | ICD-10-CM | POA: Insufficient documentation

## 2023-08-24 DIAGNOSIS — R768 Other specified abnormal immunological findings in serum: Secondary | ICD-10-CM | POA: Diagnosis present

## 2023-08-24 DIAGNOSIS — M170 Bilateral primary osteoarthritis of knee: Secondary | ICD-10-CM | POA: Insufficient documentation

## 2023-08-24 DIAGNOSIS — I10 Essential (primary) hypertension: Secondary | ICD-10-CM | POA: Diagnosis present

## 2023-08-24 DIAGNOSIS — G8929 Other chronic pain: Secondary | ICD-10-CM | POA: Diagnosis present

## 2023-08-24 DIAGNOSIS — Z8679 Personal history of other diseases of the circulatory system: Secondary | ICD-10-CM | POA: Insufficient documentation

## 2023-08-24 DIAGNOSIS — I252 Old myocardial infarction: Secondary | ICD-10-CM | POA: Insufficient documentation

## 2023-08-24 DIAGNOSIS — R7982 Elevated C-reactive protein (CRP): Secondary | ICD-10-CM | POA: Insufficient documentation

## 2023-08-24 DIAGNOSIS — Z5181 Encounter for therapeutic drug level monitoring: Secondary | ICD-10-CM | POA: Insufficient documentation

## 2023-08-24 DIAGNOSIS — M79641 Pain in right hand: Secondary | ICD-10-CM | POA: Diagnosis present

## 2023-08-24 DIAGNOSIS — R700 Elevated erythrocyte sedimentation rate: Secondary | ICD-10-CM | POA: Insufficient documentation

## 2023-08-24 DIAGNOSIS — E782 Mixed hyperlipidemia: Secondary | ICD-10-CM | POA: Insufficient documentation

## 2023-08-24 DIAGNOSIS — M79642 Pain in left hand: Secondary | ICD-10-CM | POA: Diagnosis present

## 2023-08-24 MED ORDER — LIDOCAINE HCL 1 % IJ SOLN
1.5000 mL | INTRAMUSCULAR | Status: AC | PRN
  Administered 2023-08-24: 1.5 mL

## 2023-08-24 MED ORDER — TRIAMCINOLONE ACETONIDE 40 MG/ML IJ SUSP
40.0000 mg | INTRAMUSCULAR | Status: AC | PRN
  Administered 2023-08-24: 40 mg via INTRA_ARTICULAR

## 2023-08-24 NOTE — Patient Instructions (Signed)
 Shoulder Exercises Ask your health care provider which exercises are safe for you. Do exercises exactly as told by your health care provider and adjust them as directed. It is normal to feel mild stretching, pulling, tightness, or discomfort as you do these exercises. Stop right away if you feel sudden pain or your pain gets worse. Do not begin these exercises until told by your health care provider. Stretching exercises External rotation and abduction This exercise is sometimes called corner stretch. The exercise rotates your arm outward (external rotation) and moves your arm out from your body (abduction). Stand in a doorway with one of your feet slightly in front of the other. This is called a staggered stance. If you cannot reach your forearms to the door frame, stand facing a corner of a room. Choose one of the following positions as told by your health care provider: Place your hands and forearms on the door frame above your head. Place your hands and forearms on the door frame at the height of your head. Place your hands on the door frame at the height of your elbows. Slowly move your weight onto your front foot until you feel a stretch across your chest and in the front of your shoulders. Keep your head and chest upright and keep your abdominal muscles tight. Hold for __________ seconds. To release the stretch, shift your weight to your back foot. Repeat __________ times. Complete this exercise __________ times a day. Extension, standing  Stand and hold a broomstick, a cane, or a similar object behind your back. Your hands should be a little wider than shoulder-width apart. Your palms should face away from your back. Keeping your elbows straight and your shoulder muscles relaxed, move the stick away from your body until you feel a stretch in your shoulders (extension). Avoid shrugging your shoulders while you move the stick. Keep your shoulder blades tucked down toward the middle of your  back. Hold for __________ seconds. Slowly return to the starting position. Repeat __________ times. Complete this exercise __________ times a day. Range-of-motion exercises Pendulum  Stand near a wall or a surface that you can hold onto for balance. Bend at the waist and let your left / right arm hang straight down. Use your other arm to support you. Keep your back straight and do not lock your knees. Relax your left / right arm and shoulder muscles, and move your hips and your trunk so your left / right arm swings freely. Your arm should swing because of the motion of your body, not because you are using your arm or shoulder muscles. Keep moving your hips and trunk so your arm swings in the following directions, as told by your health care provider: Side to side. Forward and backward. In clockwise and counterclockwise circles. Continue each motion for __________ seconds, or for as long as told by your health care provider. Slowly return to the starting position. Repeat __________ times. Complete this exercise __________ times a day. Shoulder flexion, standing  Stand and hold a broomstick, a cane, or a similar object. Place your hands a little more than shoulder-width apart on the object. Your left / right hand should be palm-up, and your other hand should be palm-down. Keep your elbow straight and your shoulder muscles relaxed. Push the stick up with your healthy arm to raise your left / right arm in front of your body, and then over your head until you feel a stretch in your shoulder (flexion). Avoid shrugging your shoulder  while you raise your arm. Keep your shoulder blade tucked down toward the middle of your back. Hold for __________ seconds. Slowly return to the starting position. Repeat __________ times. Complete this exercise __________ times a day. Shoulder abduction, standing  Stand and hold a broomstick, a cane, or a similar object. Place your hands a little more than  shoulder-width apart on the object. Your left / right hand should be palm-up, and your other hand should be palm-down. Keep your elbow straight and your shoulder muscles relaxed. Push the object across your body toward your left / right side. Raise your left / right arm to the side of your body (abduction) until you feel a stretch in your shoulder. Do not raise your arm above shoulder height unless your health care provider tells you to do that. If directed, raise your arm over your head. Avoid shrugging your shoulder while you raise your arm. Keep your shoulder blade tucked down toward the middle of your back. Hold for __________ seconds. Slowly return to the starting position. Repeat __________ times. Complete this exercise __________ times a day. Internal rotation  Place your left / right hand behind your back, palm-up. Use your other hand to dangle an exercise band, a broomstick, or a similar object over your shoulder. Grasp the band with your left / right hand so you are holding on to both ends. Gently pull up on the band until you feel a stretch in the front of your left / right shoulder. The movement of your arm toward the center of your body is called internal rotation. Avoid shrugging your shoulder while you raise your arm. Keep your shoulder blade tucked down toward the middle of your back. Hold for __________ seconds. Release the stretch by letting go of the band and lowering your hands. Repeat __________ times. Complete this exercise __________ times a day. Strengthening exercises External rotation  Sit in a stable chair without armrests. Secure an exercise band to a stable object at elbow height on your left / right side. Place a soft object, such as a folded towel or a small pillow, between your left / right upper arm and your body to move your elbow about 4 inches (10 cm) away from your side. Hold the end of the exercise band so it is tight and there is no slack. Keeping your  elbow pressed against the soft object, slowly move your forearm out, away from your abdomen (external rotation). Keep your body steady so only your forearm moves. Hold for __________ seconds. Slowly return to the starting position. Repeat __________ times. Complete this exercise __________ times a day. Shoulder abduction  Sit in a stable chair without armrests, or stand up. Hold a __________ lb / kg weight in your left / right hand, or hold an exercise band with both hands. Start with your arms straight down and your left / right palm facing in, toward your body. Slowly lift your left / right hand out to your side (abduction). Do not lift your hand above shoulder height unless your health care provider tells you that this is safe. Keep your arms straight. Avoid shrugging your shoulder while you do this movement. Keep your shoulder blade tucked down toward the middle of your back. Hold for __________ seconds. Slowly lower your arm, and return to the starting position. Repeat __________ times. Complete this exercise __________ times a day. Shoulder extension  Sit in a stable chair without armrests, or stand up. Secure an exercise band to a  stable object in front of you so it is at shoulder height. Hold one end of the exercise band in each hand. Straighten your elbows and lift your hands up to shoulder height. Squeeze your shoulder blades together as you pull your hands down to the sides of your thighs (extension). Stop when your hands are straight down by your sides. Do not let your hands go behind your body. Hold for __________ seconds. Slowly return to the starting position. Repeat __________ times. Complete this exercise __________ times a day. Shoulder row  Sit in a stable chair without armrests, or stand up. Secure an exercise band to a stable object in front of you so it is at chest height. Hold one end of the exercise band in each hand. Position your palms so that your thumbs are  facing the ceiling (neutral position). Bend each of your elbows to a 90-degree angle (right angle) and keep your upper arms at your sides. Step back or move the chair back until the band is tight and there is no slack. Slowly pull your elbows back behind you. Hold for __________ seconds. Slowly return to the starting position. Repeat __________ times. Complete this exercise __________ times a day. Shoulder press-ups  Sit in a stable chair that has armrests. Sit upright, with your feet flat on the floor. Put your hands on the armrests so your elbows are bent and your fingers are pointing forward. Your hands should be about even with the sides of your body. Push down on the armrests and use your arms to lift yourself off the chair. Straighten your elbows and lift yourself up as much as you comfortably can. Move your shoulder blades down, and avoid letting your shoulders move up toward your ears. Keep your feet on the ground. As you get stronger, your feet should support less of your body weight as you lift yourself up. Hold for __________ seconds. Slowly lower yourself back into the chair. Repeat __________ times. Complete this exercise __________ times a day. Wall push-ups  Stand so you are facing a stable wall. Your feet should be about one arm-length away from the wall. Lean forward and place your palms on the wall at shoulder height. Keep your feet flat on the floor as you bend your elbows and lean forward toward the wall. Hold for __________ seconds. Straighten your elbows to push yourself back to the starting position. Repeat __________ times. Complete this exercise __________ times a day. This information is not intended to replace advice given to you by your health care provider. Make sure you discuss any questions you have with your health care provider. Document Revised: 03/23/2021 Document Reviewed: 03/23/2021 Elsevier Patient Education  2024 [employer]  Exercises for  Chronic Knee Pain Chronic knee pain is pain that lasts longer than 3 months. For most people with chronic knee pain, exercise and weight loss is an important part of treatment. Your health care provider may want you to focus on: Making the muscles that support your knee stronger. This can take pressure off your knee and reduce pain. Preventing knee stiffness. How far you can move your knee, keeping it there or making it farther. Losing weight (if this applies) to take pressure off your knee, lower your risk for injury, and make it easier for you to exercise. Your provider will help you make an exercise program that fits your needs and physical abilities. Below are simple, low-impact exercises you can do at home. Ask your provider or physical therapist  how often you should do your exercise program and how many times to repeat each exercise. General safety tips  Get your provider's approval before doing any exercises. Start slowly and stop any time you feel pain. Do not exercise if your knee pain is flaring up. Warm up first. Stretching a cold muscle can cause an injury. Do 5-10 minutes of easy movement or light stretching before beginning your exercises. Do 5-10 minutes of low-impact activity (like walking or cycling) before starting strengthening exercises. Contact your provider any time you have pain during or after exercising. Exercise can cause discomfort but should not be painful. It is normal to be a little stiff or sore after exercising. Stretching and range-of-motion exercises Front thigh stretch  Stand up straight and support your body by holding on to a chair or resting one hand on a wall. With your legs straight and close together, bend one knee to lift your heel up toward your butt. Using one hand for support, grab your ankle with your free hand. Pull your foot up closer toward your butt to feel the stretch in front of your thigh. Hold the stretch for 30 seconds. Repeat __________  times. Complete this exercise __________ times a day. Back thigh stretch  Sit on the floor with your back straight and your legs out straight in front of you. Place the palms of your hands on the floor and slide them toward your feet as you bend at the hip. Try to touch your nose to your knees and feel the stretch in the back of your thighs. Hold for 30 seconds. Repeat __________ times. Complete this exercise __________ times a day. Calf stretch  Stand facing a wall. Place the palms of your hands flat against the wall, arms extended, and lean slightly against the wall. Get into a lunge position with one leg bent at the knee and the other leg stretched out straight behind you. Keep both feet facing the wall and increase the bend in your knee while keeping the heel of the other leg flat on the ground. You should feel the stretch in your calf. Hold for 30 seconds. Repeat __________ times. Complete this exercise __________ times a day. Strengthening exercises Straight leg lift  Lie on your back with one knee bent and the other leg out straight. Slowly lift the straight leg without bending the knee. Lift until your foot is about 12 inches (30 cm) off the floor. Hold for 3-5 seconds and slowly lower your leg. Repeat __________ times. Complete this exercise __________ times a day. Single leg dip  Stand between two chairs and put both hands on the backs of the chairs for support. Extend one leg out straight with your body weight resting on the heel of the standing leg. Slowly bend your standing knee to dip your body to the level that is comfortable for you. Hold for 3-5 seconds. Repeat __________ times. Complete this exercise __________ times a day. Hamstring curls  Stand straight, knees close together, facing the back of a chair. Hold on to the back of a chair with both hands. Keep one leg straight. Bend the other knee while bringing the heel up toward the butt until the knee is bent at a  90-degree angle (right angle). Hold for 3-5 seconds. Repeat __________ times. Complete this exercise __________ times a day. Wall squat  Stand straight with your back, hips, and head against a wall. Step forward one foot at a time with your back still against the  wall. Your feet should be 2 feet (61 cm) from the wall at shoulder width. Keeping your back, hips, and head against the wall, slide down the wall to as close to a sitting position as you can get. Hold for 5-10 seconds, then slowly slide back up. Repeat __________ times. Complete this exercise __________ times a day. Step-ups  Stand in front of a sturdy platform or stool that is about 6 inches (15 cm) high. Slowly step up with your left / right foot, keeping your knee in line with your hip and foot. Do not let your knee bend so far that you cannot see your toes. Hold on to a chair for balance, but do not use it for support. Slowly unlock your knee and lower yourself to the starting position. Repeat __________ times. Complete this exercise __________ times a day. Contact a health care provider if: Your exercises cause pain. Your pain is worse after you exercise. Your pain prevents you from doing your exercises. This information is not intended to replace advice given to you by your health care provider. Make sure you discuss any questions you have with your health care provider. Document Revised: 02/15/2022 Document Reviewed: 02/15/2022 Elsevier Patient Education  2024 [employer].

## 2023-09-18 ENCOUNTER — Other Ambulatory Visit: Payer: Self-pay | Admitting: Rheumatology

## 2023-09-18 DIAGNOSIS — M1A09X0 Idiopathic chronic gout, multiple sites, without tophus (tophi): Secondary | ICD-10-CM

## 2023-09-18 NOTE — Telephone Encounter (Signed)
 Last Fill: 06/21/2023  Labs: 05/03/2023 Creatinine is elevated-1.44.  rest of CMP WNL.  Please advise the patient to increase water intake.   CBC stable.  Uric acid improving--6.0.    08/15/2023  CMP WNL--renal function has improved significantly.   Uric acid is within the desirable range--4.6--great news! Continue current treatment regimen  Next Visit: 11/28/2023  Last Visit: 08/24/2023  DX: Idiopathic chronic gout of multiple sites with tophus   Current Dose per office note 08/24/2023: colchicine  0.6 mg daily   Okay to refill Colchicine ?

## 2023-09-27 ENCOUNTER — Ambulatory Visit: Payer: Self-pay | Admitting: Cardiovascular Disease

## 2023-09-27 ENCOUNTER — Ambulatory Visit (HOSPITAL_COMMUNITY)
Admission: RE | Admit: 2023-09-27 | Discharge: 2023-09-27 | Disposition: A | Discharge status: Home / Self Care | Source: Ambulatory Visit | Attending: Cardiovascular Disease | Admitting: Cardiovascular Disease

## 2023-09-27 DIAGNOSIS — I10 Essential (primary) hypertension: Secondary | ICD-10-CM

## 2023-09-27 DIAGNOSIS — E782 Mixed hyperlipidemia: Secondary | ICD-10-CM

## 2023-09-27 DIAGNOSIS — I25810 Atherosclerosis of coronary artery bypass graft(s) without angina pectoris: Secondary | ICD-10-CM | POA: Diagnosis present

## 2023-09-27 LAB — ECHOCARDIOGRAM COMPLETE
Area-P 1/2: 3.89 cm2
S' Lateral: 4 cm

## 2023-09-30 ENCOUNTER — Other Ambulatory Visit: Payer: Self-pay | Admitting: Physician Assistant

## 2023-09-30 DIAGNOSIS — M1A09X1 Idiopathic chronic gout, multiple sites, with tophus (tophi): Secondary | ICD-10-CM

## 2023-10-02 ENCOUNTER — Encounter: Payer: Self-pay | Admitting: Family Medicine

## 2023-10-02 ENCOUNTER — Ambulatory Visit: Admitting: Family Medicine

## 2023-10-02 VITALS — BP 138/84 | HR 76 | Temp 97.9°F | Ht 70.5 in | Wt 243.0 lb

## 2023-10-02 DIAGNOSIS — M1A09X0 Idiopathic chronic gout, multiple sites, without tophus (tophi): Secondary | ICD-10-CM | POA: Diagnosis not present

## 2023-10-02 DIAGNOSIS — I10 Essential (primary) hypertension: Secondary | ICD-10-CM | POA: Diagnosis not present

## 2023-10-02 DIAGNOSIS — E782 Mixed hyperlipidemia: Secondary | ICD-10-CM

## 2023-10-02 DIAGNOSIS — Z79899 Other long term (current) drug therapy: Secondary | ICD-10-CM | POA: Diagnosis not present

## 2023-10-02 DIAGNOSIS — I25810 Atherosclerosis of coronary artery bypass graft(s) without angina pectoris: Secondary | ICD-10-CM

## 2023-10-02 DIAGNOSIS — M0580 Other rheumatoid arthritis with rheumatoid factor of unspecified site: Secondary | ICD-10-CM

## 2023-10-02 DIAGNOSIS — K047 Periapical abscess without sinus: Secondary | ICD-10-CM | POA: Diagnosis not present

## 2023-10-02 DIAGNOSIS — F330 Major depressive disorder, recurrent, mild: Secondary | ICD-10-CM | POA: Diagnosis not present

## 2023-10-02 LAB — CBC WITH DIFFERENTIAL/PLATELET
Basophils Absolute: 0.1 K/uL (ref 0.0–0.1)
Basophils Relative: 0.6 % (ref 0.0–3.0)
Eosinophils Absolute: 0.1 K/uL (ref 0.0–0.7)
Eosinophils Relative: 1.2 % (ref 0.0–5.0)
HCT: 52.2 % — ABNORMAL HIGH (ref 39.0–52.0)
Hemoglobin: 17.2 g/dL — ABNORMAL HIGH (ref 13.0–17.0)
Lymphocytes Relative: 13.4 % (ref 12.0–46.0)
Lymphs Abs: 1.4 K/uL (ref 0.7–4.0)
MCHC: 32.9 g/dL (ref 30.0–36.0)
MCV: 89.2 fl (ref 78.0–100.0)
Monocytes Absolute: 0.7 K/uL (ref 0.1–1.0)
Monocytes Relative: 7 % (ref 3.0–12.0)
Neutro Abs: 8.2 K/uL — ABNORMAL HIGH (ref 1.4–7.7)
Neutrophils Relative %: 77.8 % — ABNORMAL HIGH (ref 43.0–77.0)
Platelets: 160 K/uL (ref 150.0–400.0)
RBC: 5.85 Mil/uL — ABNORMAL HIGH (ref 4.22–5.81)
RDW: 16.3 % — ABNORMAL HIGH (ref 11.5–15.5)
WBC: 10.6 K/uL — ABNORMAL HIGH (ref 4.0–10.5)

## 2023-10-02 LAB — COMPREHENSIVE METABOLIC PANEL WITH GFR
ALT: 61 U/L — ABNORMAL HIGH (ref 0–53)
AST: 56 U/L — ABNORMAL HIGH (ref 0–37)
Albumin: 4.1 g/dL (ref 3.5–5.2)
Alkaline Phosphatase: 96 U/L (ref 39–117)
BUN: 21 mg/dL (ref 6–23)
CO2: 25 meq/L (ref 19–32)
Calcium: 9.1 mg/dL (ref 8.4–10.5)
Chloride: 102 meq/L (ref 96–112)
Creatinine, Ser: 1.43 mg/dL (ref 0.40–1.50)
GFR: 55.15 mL/min — ABNORMAL LOW (ref >60.00)
Glucose, Bld: 88 mg/dL (ref 70–99)
Potassium: 4.4 meq/L (ref 3.5–5.1)
Sodium: 138 meq/L (ref 135–145)
Total Bilirubin: 0.8 mg/dL (ref 0.2–1.2)
Total Protein: 6.7 g/dL (ref 6.0–8.3)

## 2023-10-02 LAB — LIPID PANEL
Cholesterol: 116 mg/dL (ref 0–200)
HDL: 37.1 mg/dL — ABNORMAL LOW (ref >39.00)
LDL Cholesterol: 39 mg/dL (ref 0–99)
NonHDL: 78.52
Total CHOL/HDL Ratio: 3
Triglycerides: 199 mg/dL — ABNORMAL HIGH (ref 0.0–149.0)
VLDL: 39.8 mg/dL (ref 0.0–40.0)

## 2023-10-02 LAB — HEMOGLOBIN A1C: Hgb A1c MFr Bld: 6.1 % (ref 4.6–6.5)

## 2023-10-02 MED ORDER — AMOXICILLIN-POT CLAVULANATE 875-125 MG PO TABS: 1.0000 | ORAL_TABLET | Freq: Two times a day (BID) | ORAL | 0 refills | Status: AC

## 2023-10-02 NOTE — Progress Notes (Signed)
 "  Established Patient Office Visit  Subjective:     Patient ID: Jerry Young., male    DOB: 1970-08-06, 53 y.o.   MRN: [id number]  No chief complaint on file.   HPI  Discussed the use of AI scribe software for clinical note transcription with the patient, who gave verbal consent to proceed.  History of Present Illness Bert Ptacek. Marcey is a 53 year old male who presents with facial swelling.  Facial edema - Acute onset of right facial swelling upon awakening this morning - No known allergies to antibiotics  Sleep disturbance - Recent difficulty with sleep  Gout - History of gout - Uric acid levels have been consistently decreasing - No serious flare-ups since the last visit - Labs completed with rheumatologist on July 1st - Follow-up with rheumatology scheduled for October  Hypertension - Blood pressure managed at home with readings around 127/80 mmHg - Elevated blood pressure readings in clinical settings attributed to 'white coat hypertension'  Depressive symptoms - Depression has improved with increased mobility     ROS Per HPI      Objective:    BP 138/84 (BP Location: Right Arm, Patient Position: Sitting, Cuff Size: Large)   Pulse 76   Temp 97.9 F (36.6 C) (Temporal)   Ht 5' 10.5 (1.791 m)   Wt 243 lb (110.2 kg)   SpO2 97%   BMI 34.37 kg/m    Physical Exam Vitals and nursing note reviewed.  Constitutional:      General: He is not in acute distress.    Appearance: Normal appearance.  HENT:     Head:     Comments: R side cheek swelling, erythema, tender    Right Ear: External ear normal.     Left Ear: External ear normal.     Nose: Nose normal.     Mouth/Throat:     Mouth: Mucous membranes are moist.     Pharynx: Oropharynx is clear.      Comments: Multiple missing teeth, lower right tooth cracked Eyes:     Extraocular Movements: Extraocular movements intact.  Cardiovascular:     Rate and Rhythm: Normal rate and regular  rhythm.     Pulses: Normal pulses.     Heart sounds: Normal heart sounds.  Pulmonary:     Effort: Pulmonary effort is normal. No respiratory distress.     Breath sounds: Normal breath sounds. No wheezing, rhonchi or rales.  Musculoskeletal:     Right lower leg: No edema.     Left lower leg: No edema.     Comments: Stiff gait, tophi at multiple joints including elbows, hands, fingers, knees  Lymphadenopathy:     Cervical: Cervical adenopathy (R>L) present.  Skin:    General: Skin is warm and dry.  Neurological:     General: No focal deficit present.     Mental Status: He is alert and oriented to person, place, and time.  Psychiatric:        Mood and Affect: Mood normal.        Behavior: Behavior normal.     Results for orders placed or performed in visit on 10/02/23  CBC with Differential/Platelet  Result Value Ref Range   WBC 10.6 (H) 4.0 - 10.5 K/uL   RBC 5.85 (H) 4.22 - 5.81 Mil/uL   Hemoglobin 17.2 (H) 13.0 - 17.0 g/dL   HCT 47.7 (H) 60.9 - 47.9 %   MCV 89.2 78.0 - 100.0 fl   MCHC 32.9 30.0 -  36.0 g/dL   RDW 83.6 (H) 88.4 - 84.4 %   Platelets 160.0 150.0 - 400.0 K/uL   Neutrophils Relative % 77.8 (H) 43.0 - 77.0 %   Lymphocytes Relative 13.4 12.0 - 46.0 %   Monocytes Relative 7.0 3.0 - 12.0 %   Eosinophils Relative 1.2 0.0 - 5.0 %   Basophils Relative 0.6 0.0 - 3.0 %   Neutro Abs 8.2 (H) 1.4 - 7.7 K/uL   Lymphs Abs 1.4 0.7 - 4.0 K/uL   Monocytes Absolute 0.7 0.1 - 1.0 K/uL   Eosinophils Absolute 0.1 0.0 - 0.7 K/uL   Basophils Absolute 0.1 0.0 - 0.1 K/uL  Comprehensive metabolic panel with GFR  Result Value Ref Range   Sodium 138 135 - 145 mEq/L   Potassium 4.4 3.5 - 5.1 mEq/L   Chloride 102 96 - 112 mEq/L   CO2 25 19 - 32 mEq/L   Glucose, Bld 88 70 - 99 mg/dL   BUN 21 6 - 23 mg/dL   Creatinine, Ser 8.56 0.40 - 1.50 mg/dL   Total Bilirubin 0.8 0.2 - 1.2 mg/dL   Alkaline Phosphatase 96 39 - 117 U/L   AST 56 (H) 0 - 37 U/L   ALT 61 (H) 0 - 53 U/L   Total  Protein 6.7 6.0 - 8.3 g/dL   Albumin 4.1 3.5 - 5.2 g/dL   GFR 44.84 (L) >39.99 mL/min   Calcium  9.1 8.4 - 10.5 mg/dL  Hemoglobin J8r  Result Value Ref Range   Hgb A1c MFr Bld 6.1 4.6 - 6.5 %  Lipid panel  Result Value Ref Range   Cholesterol 116 0 - 200 mg/dL   Triglycerides 800.9 (H) 0.0 - 149.0 mg/dL   HDL 62.89 (L) >60.99 mg/dL   VLDL 60.1 0.0 - 59.9 mg/dL   LDL Cholesterol 39 0 - 99 mg/dL   Total CHOL/HDL Ratio 3    NonHDL 78.52   VITAMIN D  25 Hydroxy (Vit-D Deficiency, Fractures)  Result Value Ref Range   VITD 11.29 (L) 30.00 - 100.00 ng/mL  Vitamin B12  Result Value Ref Range   Vitamin B-12 296 211 - 911 pg/mL    The ASCVD Risk score (Arnett DK, et al., 2019) failed to calculate for the following reasons:   Risk score cannot be calculated because patient has a medical history suggesting prior/existing ASCVD  BP Readings from Last 3 Encounters:  10/02/23 138/84  08/24/23 (!) 146/91  06/29/23 135/89   Wt Readings from Last 3 Encounters:  10/02/23 243 lb (110.2 kg)  08/24/23 243 lb (110.2 kg)  06/29/23 239 lb (108.4 kg)      Last CBC Lab Results  Component Value Date   WBC 10.6 (H) 10/02/2023   HGB 17.2 (H) 10/02/2023   HCT 52.2 (H) 10/02/2023   MCV 89.2 10/02/2023   MCH 28.3 05/03/2023   RDW 16.3 (H) 10/02/2023   PLT 160.0 10/02/2023   Last metabolic panel Lab Results  Component Value Date   GLUCOSE 88 10/02/2023   NA 138 10/02/2023   K 4.4 10/02/2023   CL 102 10/02/2023   CO2 25 10/02/2023   BUN 21 10/02/2023   CREATININE 1.43 10/02/2023   EGFR 74 08/15/2023   CALCIUM  9.1 10/02/2023   PROT 6.7 10/02/2023   ALBUMIN 4.1 10/02/2023   BILITOT 0.8 10/02/2023   ALKPHOS 96 10/02/2023   AST 56 (H) 10/02/2023   ALT 61 (H) 10/02/2023   ANIONGAP 16 (H) 07/24/2019   Last lipids Lab Results  Component Value Date   CHOL 116 10/02/2023   HDL 37.10 (L) 10/02/2023   LDLCALC 39 10/02/2023   TRIG 199.0 (H) 10/02/2023   CHOLHDL 3 10/02/2023   Last  hemoglobin A1c Lab Results  Component Value Date   HGBA1C 6.1 10/02/2023   Last thyroid functions No results found for: TSH, T3TOTAL, T4TOTAL, THYROIDAB Last vitamin D  Lab Results  Component Value Date   VD25OH 11.29 (L) 10/02/2023   Last vitamin B12 and Folate Lab Results  Component Value Date   VITAMINB12 296 10/02/2023         Assessment & Plan:   Assessment and Plan Assessment & Plan Dental Infection Acute facial swelling likely due to a dental infection. - Prescribed Augmentin . - Advised ice application to cheek for comfort. - Scheduled dental appointment on Thursday if infection improves.  Idiopathic chronic gout, multiple sites, without tophus Chronic gout with no recent serious flare-ups. Uric acid levels consistently decreasing.  Depression Depression symptoms have improved, likely due to increased mobility and activity.  Primary hypertension Blood pressure readings at home approximately 127/80 mmHg using a wrist monitor. - Advised to bring wrist blood pressure monitor to next appointment for comparison.  Mixed hyperlipidemia Cholesterol levels have not been checked recently. He fasted in preparation for lab work. - Ordered cholesterol panel.  CAD - Discussed that controlling blood pressure, blood sugars and cholesterol will be beneficial to stable heart function     Orders Placed This Encounter  Procedures   CBC with Differential/Platelet    Release to patient:   Immediate [1]   Comprehensive metabolic panel with GFR    Release to patient:   Immediate [1]   Hemoglobin A1c   Lipid panel   VITAMIN D  25 Hydroxy (Vit-D Deficiency, Fractures)   Vitamin B12     Meds ordered this encounter  Medications   amoxicillin -clavulanate (AUGMENTIN ) 875-125 MG tablet    Sig: Take 1 tablet by mouth 2 (two) times daily.    Dispense:  20 tablet    Refill:  0    Return in about 6 months (around 04/03/2024) for meds/labs/CPE.  Corean LITTIE Ku,  FNP   "

## 2023-10-02 NOTE — Telephone Encounter (Signed)
 Last Fill: 06/29/2023  Labs: 08/15/2023 CMP WNL--renal function has improved significantly.Uric acid is within the desirable range--4.6 3, 05/03/2023 CBC stable.   Next Visit: 11/28/2023  Last Visit: 08/24/2023  DX:  Idiopathic chronic gout of multiple sites with tophus  Current Dose per office note 08/24/2023: Allopurinol  600 mg daily  Okay to refill Allopurinol ?

## 2023-10-03 ENCOUNTER — Ambulatory Visit: Payer: Self-pay | Admitting: Family Medicine

## 2023-10-03 DIAGNOSIS — E559 Vitamin D deficiency, unspecified: Secondary | ICD-10-CM

## 2023-10-03 LAB — VITAMIN B12: Vitamin B-12: 296 pg/mL (ref 211–911)

## 2023-10-03 LAB — VITAMIN D 25 HYDROXY (VIT D DEFICIENCY, FRACTURES): VITD: 11.29 ng/mL — ABNORMAL LOW (ref 30.00–100.00)

## 2023-10-03 MED ORDER — VITAMIN D (ERGOCALCIFEROL) 1.25 MG (50000 UNIT) PO CAPS: 50000.0000 [IU] | ORAL_CAPSULE | ORAL | 0 refills | Status: AC

## 2023-10-03 NOTE — Patient Instructions (Addendum)
 Continue current medication regimen  Follow up with specialists as scheduled  We are checking labs today, will be in contact with any results that require further attention.  I have sent in Augmentin  for you to take twice a day for 10 days.  This medication can upset your stomach, so I tell everyone to take it with a meal.  Follow up with the dentist as scheduled on Thursday  Follow-up with me in 6 mos for physical, sooner if needed.

## 2023-10-31 ENCOUNTER — Other Ambulatory Visit: Payer: Self-pay | Admitting: Physician Assistant

## 2023-10-31 NOTE — Telephone Encounter (Signed)
 Last Fill: 05/29/2023  Labs: 10/02/2023 AST 56 ALT 61 GFR 55.15 WBC 10.6 RBC 5.85 Hemoglobin 17.2 HCT 52.2 RDW 16.3 Neutrophils Relative 77.8 Neutro Abs 8.2  08/15/2023 Uric Acid 4.6  Next Visit: 11/28/2023  Last Visit: 08/24/2023  DX: Idiopathic chronic gout of multiple sites with tophus   Current Dose per office note 08/24/2023: probenecid  500 mg 1 tablet twice daily   Okay to refill Probenecid ?

## 2023-11-14 NOTE — Progress Notes (Signed)
 Office Visit Note  Patient: Jerry Young.             Date of Birth: 1970-04-17           MRN: [id number]             PCP: Alvia Corean CROME, FNP Referring: Alvia Corean CROME, * Visit Date: 11/28/2023 Occupation: Data Unavailable  Subjective:  Discuss lab results   History of Present Illness: Jerry Young. is a 53 y.o. male with history of gout.  Patient remains on Allopurinol  600 mg daily, colchicine  0.6 mg daily, and probenecid  500 mg 1 tablet twice daily.  He is tolerating combination therapy without any side effects and has not had any gaps in therapy.  Patient reports that since Saturday he has been experiencing a gout flare affecting the left thumb.  He currently rates the pain a 4 out of 10.  The pain and stiffness is most severe first thing in the mornings and in the evenings.  He has been taking Tylenol  as needed for symptomatic relief. He occasionally has twinges of pain but this is the first time he has had a full flare on the current treatment regimen. He has not yet noticed any improvement in the tophi.  He continues to have ongoing discomfort in the right shoulder especially with range of motion.  He had no improvement after the glenohumeral joint injection performed on 08/24/2023.  Patient states that overall his knee joint pain has been manageable. He denies any new medical conditions.  He is currently filing for disability due to the limitations caused by severe erosive gout.      Activities of Daily Living:  Patient reports morning stiffness for 5-10 minutes.   Patient Reports nocturnal pain.  Difficulty dressing/grooming: Reports Difficulty climbing stairs: Reports Difficulty getting out of chair: Reports Difficulty using hands for taps, buttons, cutlery, and/or writing: Reports  Review of Systems  Constitutional:  Positive for fatigue.  HENT:  Negative for mouth sores and mouth dryness.   Eyes:  Negative for dryness.  Respiratory:  Positive for  shortness of breath.   Cardiovascular:  Positive for palpitations. Negative for chest pain.  Gastrointestinal:  Negative for blood in stool, constipation and diarrhea.  Endocrine: Negative for increased urination.  Genitourinary:  Negative for involuntary urination.  Musculoskeletal:  Positive for joint pain, joint pain, joint swelling, myalgias, muscle weakness, morning stiffness, muscle tenderness and myalgias. Negative for gait problem.  Skin:  Positive for sensitivity to sunlight. Negative for color change, rash and hair loss.  Allergic/Immunologic: Negative for susceptible to infections.  Neurological:  Negative for dizziness and headaches.  Hematological:  Negative for swollen glands.  Psychiatric/Behavioral:  Positive for depressed mood and sleep disturbance. The patient is nervous/anxious.     PMFS History:  Patient Active Problem List   Diagnosis Date Noted   Medication management 10/02/2023   Dental infection 10/02/2023   Polyarthritis with positive rheumatoid factor (HCC) 03/28/2023   MDD (major depressive disorder), recurrent episode, mild 03/28/2023   Primary hypertension 02/28/2023   Mixed hyperlipidemia 02/28/2023   Chronic gout of multiple sites 02/28/2023   Coronary artery disease 02/28/2023    Past Medical History:  Diagnosis Date   Coronary artery disease    Gout    Heart murmur    Hypertension    Myocardial infarction (HCC) 09/24/19   Had triple bypass surgery    Family History  Problem Relation Age of Onset   Arthritis Mother  Arthritis Father    Heart disease Father    Hypertension Father    Kidney disease Father    Arthritis Paternal Aunt    Heart disease Paternal Aunt    Hypertension Paternal Aunt    Arthritis Paternal Uncle    Heart disease Paternal Uncle    Hypertension Paternal Uncle    Arthritis Maternal Grandmother    Arthritis Maternal Grandfather    Cancer Maternal Grandfather    Heart disease Maternal Grandfather    Hypertension  Maternal Grandfather    Arthritis Paternal Grandmother    Arthritis Paternal Grandfather    Heart disease Paternal Grandfather    Hypertension Paternal Grandfather    Past Surgical History:  Procedure Laterality Date   CORONARY ARTERY BYPASS GRAFT  09/24/19   Triple bypass   RIGHT/LEFT HEART CATH AND CORONARY/GRAFT ANGIOGRAPHY N/A 05/26/2023   Procedure: RIGHT/LEFT HEART CATH AND CORONARY/GRAFT ANGIOGRAPHY;  Surgeon: Anner Alm ORN, MD;  Location: [facility];  Service: Cardiovascular;  Laterality: N/A;   triple bypass  09/23/2020   Social History   Tobacco Use   Smoking status: Never    Passive exposure: Past   Smokeless tobacco: Current    Types: Chew   Tobacco comments:    I use chewing tobacco  Vaping Use   Vaping status: Never Used  Substance Use Topics   Alcohol use: Yes    Comment: Varies but not often   Drug use: No   Social History   Social History Narrative   Not on file      There is no immunization history on file for this patient.   Objective: Vital Signs: BP (!) 148/97   Pulse 70   Temp 97.7 F (36.5 C)   Resp 14   Ht 5' 10.5 (1.791 m)   Wt 247 lb 12.8 oz (112.4 kg)   BMI 35.05 kg/m    Physical Exam Vitals and nursing note reviewed.  Constitutional:      Appearance: He is well-developed.  HENT:     Head: Normocephalic and atraumatic.  Eyes:     Conjunctiva/sclera: Conjunctivae normal.     Pupils: Pupils are equal, round, and reactive to light.  Cardiovascular:     Rate and Rhythm: Normal rate and regular rhythm.     Heart sounds: Normal heart sounds.  Pulmonary:     Effort: Pulmonary effort is normal.     Breath sounds: Normal breath sounds.  Abdominal:     General: Bowel sounds are normal.     Palpations: Abdomen is soft.  Musculoskeletal:     Cervical back: Normal range of motion and neck supple.  Skin:    General: Skin is warm and dry.     Capillary Refill: Capillary refill takes less than 2 seconds.  Neurological:      Mental Status: He is alert and oriented to person, place, and time.  Psychiatric:        Behavior: Behavior normal.      Musculoskeletal Exam: C-spine has good range of motion.  Right shoulder has painful and limited range of motion with full abduction and internal rotation.  Left shoulder has full range of motion.  Large tophi present on the extensor surface of both elbows.  Limited range of motion of both wrist joints.  Tenderness, swelling, and erythema noted in the left first MCP joint.  Tophi overlying several PIP joints.  Hip joints have good range of motion with no discomfort at this time.  Limited extension  of both knees with prepatellar tophi present bilaterally.  No tenderness of ankle joints.  CDAI Exam: CDAI Score: -- Patient Global: --; Provider Global: -- Swollen: --; Tender: -- Joint Exam 11/28/2023   No joint exam has been documented for this visit   There is currently no information documented on the homunculus. Go to the Rheumatology activity and complete the homunculus joint exam.  Investigation: No additional findings.  Imaging: No results found.  Recent Labs: Lab Results  Component Value Date   WBC 7.2 11/27/2023   HGB 17.4 (H) 11/27/2023   PLT 177 11/27/2023   NA 138 11/27/2023   K 4.5 11/27/2023   CL 104 11/27/2023   CO2 25 11/27/2023   GLUCOSE 106 11/27/2023   BUN 22 11/27/2023   CREATININE 1.39 (H) 11/27/2023   BILITOT 0.7 11/27/2023   ALKPHOS 96 10/02/2023   AST 24 11/27/2023   ALT 27 11/27/2023   PROT 6.4 11/27/2023   ALBUMIN 4.1 10/02/2023   CALCIUM  9.6 11/27/2023   GFRAA 54 (L) 07/24/2019    Speciality Comments: No specialty comments available.  Procedures:  No procedures performed Allergies: Patient has no known allergies.   Assessment / Plan:     Visit Diagnoses: Idiopathic chronic gout of multiple sites with tophus Diagnosed at age 57, family history of gout-paternal grandmother, tophi- elbows, both hands,knees (photos in  encounter from 03/24/23), responsive to prednisone , hx of intermittent use of allopurinol :  Previous therapy includes indomethacin, colchicine , allopurinol , and prednisone .  He is not a good candidate for Uloric due to history of MI/triple bypass/hyperlipidemia/hypertension/CAD. Uric acid was 10.4 on 02/28/2023.  Allopurinol  and colchicine  were reinitiated at initial visit on 03/24/2023 (started at 150 mg x1wk, 300 mg daily x2 wk, then 450 mg daily as of 04/08/23)--uric acid improved to 7.8 on 04/10/2023.   Probenecid  500 mg 1 tablet twice daily was added on 04/20/23.  Uric acid was rechecked on 05/03/2023: Uric acid was 6.0. The dose of allopurinol  was increased to 600 mg daily starting 05/22/23--uric acid rechecked on 06/26/23: improved to 5.1.  Uric acid further improved to 4.6 on 08/15/2023 and was rechecked yesterday on 11/27/2023 at which time it has remained at 4.6.  He has occasional twinges of pain but up until Saturday he had not had any recurrence of a gout flare on the current treatment regimen.  Since Saturday has had increased pain and inflammation in the left thumb especially the left first MCP joint.  Currently his symptoms are manageable at a 4 out of 10 on the pain scale.  He requested a prednisone  taper to take in case his symptoms do not subside.  Discussed the importance of avoiding a purine rich diet. Discussed the possibility of increasing the dose of allopurinol  if he starts to have more recurrent flares.  No medication changes will be made at this time.  He was advised to notify us  if he develops any new or worsening symptoms. He will follow up in 3 months or sooner if needed.   Medication monitoring encounter - Allopurinol  600 mg daily, colchicine  0.6 mg daily, and probenecid  500 mg 1 tablet twice daily.  Reviewed lab results from 11/27/2023.  Uric acid updated yesterday on 11/28/23: 4.6.  No medication changes will be made at this time.  Chronic right shoulder pain: X-rays of the right  shoulder from 08/24/2023 were unremarkable.  He had mild spurring but no glenohumeral or AC joint narrowing noted.  He had a right glenohumeral joint cortisone injection performed on  08/24/2023 which provided no relief.  He has tried to preserve the range of motion of his right shoulder but has had persistent discomfort.  Discussed that he may want to seek evaluation by orthopedics to ensure he does not have a rotator cuff tear.  Rheumatoid factor positive - RF 26 on 02/28/23-no clinical features of rheumatoid arthritis at this time  Pain in both hands: X-rays of both hands were consistent with gouty arthropathy and osteoarthritis overlap.  Erosive changes were noted.  He presents today experiencing a gout flare affecting the left thumb primarily the left first MCP joint-tenderness, swelling, and erythema noted.  Primary osteoarthritis of both knees: X-rays of both knees were consistent with moderate osteoarthritis and moderate chondromalacia patella on 04/20/2023: Limited extension with prepatellar tophi noted bilaterally.  He had a left knee joint cortisone injection on 05/18/2023 and a right knee cortisone injection performed on 05/23/2023.  Overall his knee joint pain has been manageable.  He continues to have difficulty climbing steps and rising from a seated position due to the pain and stiffness involving both knees.  He is unable to stand for prolonged periods of time or sit for prolonged periods of time due to the severity of pain.  He is currently filing for disability.  Positive ANA (antinuclear antibody) - 02/28/23: ANA 1:40NH, uric acid 10.4, CRP 24.0, ESR 104, RF 26: No clinical features of systemic lupus.  Elevated C-reactive protein (CRP): ESR and CRP within normal limits on 05/22/2023.  Elevated sed rate: ESR and CRP within normal limits on 05/22/2023  Other medical conditions are listed as follows:  Primary hypertension: Blood pressure was elevated today in the office and was rechecked prior to  leaving.  Patient was advised to monitor blood pressure closely and to reach out to PCP if blood pressure remains elevated.  Mixed hyperlipidemia  History of MI (myocardial infarction): Not a good candidate for Uloric.  History of coronary artery disease  Orders: No orders of the defined types were placed in this encounter.  Meds ordered this encounter  Medications   predniSONE  (DELTASONE ) 5 MG tablet    Sig: Take 4 tabs po x 3 days, 3  tabs po x 3 days, 2  tabs po x 3 days, 1  tab po x 3 days    Dispense:  30 tablet    Refill:  0    Follow-Up Instructions: Return in about 3 months (around 02/28/2024) for Gout.   Waddell CHRISTELLA Craze, PA-C  Note - This record has been created using Dragon software.  Chart creation errors have been sought, but may not always  have been located. Such creation errors do not reflect on  the standard of medical care.

## 2023-11-27 ENCOUNTER — Other Ambulatory Visit: Payer: Self-pay

## 2023-11-27 DIAGNOSIS — M1A09X1 Idiopathic chronic gout, multiple sites, with tophus (tophi): Secondary | ICD-10-CM

## 2023-11-27 DIAGNOSIS — Z5181 Encounter for therapeutic drug level monitoring: Secondary | ICD-10-CM

## 2023-11-28 ENCOUNTER — Ambulatory Visit: Payer: Self-pay | Admitting: Rheumatology

## 2023-11-28 ENCOUNTER — Encounter: Payer: Self-pay | Admitting: Physician Assistant

## 2023-11-28 ENCOUNTER — Ambulatory Visit: Attending: Physician Assistant | Admitting: Physician Assistant

## 2023-11-28 VITALS — BP 148/97 | HR 70 | Temp 97.7°F | Resp 14 | Ht 70.5 in | Wt 247.8 lb

## 2023-11-28 DIAGNOSIS — M79641 Pain in right hand: Secondary | ICD-10-CM | POA: Diagnosis present

## 2023-11-28 DIAGNOSIS — M25511 Pain in right shoulder: Secondary | ICD-10-CM | POA: Insufficient documentation

## 2023-11-28 DIAGNOSIS — M1A09X1 Idiopathic chronic gout, multiple sites, with tophus (tophi): Secondary | ICD-10-CM | POA: Diagnosis present

## 2023-11-28 DIAGNOSIS — R7689 Other specified abnormal immunological findings in serum: Secondary | ICD-10-CM | POA: Diagnosis present

## 2023-11-28 DIAGNOSIS — I10 Essential (primary) hypertension: Secondary | ICD-10-CM | POA: Diagnosis present

## 2023-11-28 DIAGNOSIS — R700 Elevated erythrocyte sedimentation rate: Secondary | ICD-10-CM | POA: Diagnosis present

## 2023-11-28 DIAGNOSIS — M79642 Pain in left hand: Secondary | ICD-10-CM | POA: Diagnosis present

## 2023-11-28 DIAGNOSIS — E782 Mixed hyperlipidemia: Secondary | ICD-10-CM | POA: Diagnosis present

## 2023-11-28 DIAGNOSIS — R7982 Elevated C-reactive protein (CRP): Secondary | ICD-10-CM | POA: Insufficient documentation

## 2023-11-28 DIAGNOSIS — Z5181 Encounter for therapeutic drug level monitoring: Secondary | ICD-10-CM | POA: Insufficient documentation

## 2023-11-28 DIAGNOSIS — G8929 Other chronic pain: Secondary | ICD-10-CM | POA: Diagnosis present

## 2023-11-28 DIAGNOSIS — I252 Old myocardial infarction: Secondary | ICD-10-CM | POA: Insufficient documentation

## 2023-11-28 DIAGNOSIS — Z8679 Personal history of other diseases of the circulatory system: Secondary | ICD-10-CM | POA: Insufficient documentation

## 2023-11-28 DIAGNOSIS — M170 Bilateral primary osteoarthritis of knee: Secondary | ICD-10-CM | POA: Diagnosis present

## 2023-11-28 LAB — CBC WITH DIFFERENTIAL/PLATELET
Absolute Lymphocytes: 2801 {cells}/uL (ref 850–3900)
Absolute Monocytes: 605 {cells}/uL (ref 200–950)
Basophils Absolute: 50 {cells}/uL (ref 0–200)
Basophils Relative: 0.7 %
Eosinophils Absolute: 194 {cells}/uL (ref 15–500)
Eosinophils Relative: 2.7 %
HCT: 52.2 % — ABNORMAL HIGH (ref 38.5–50.0)
Hemoglobin: 17.4 g/dL — ABNORMAL HIGH (ref 13.2–17.1)
MCH: 29.7 pg (ref 27.0–33.0)
MCHC: 33.3 g/dL (ref 32.0–36.0)
MCV: 89.1 fL (ref 80.0–100.0)
MPV: 10.3 fL (ref 7.5–12.5)
Monocytes Relative: 8.4 %
Neutro Abs: 3550 {cells}/uL (ref 1500–7800)
Neutrophils Relative %: 49.3 %
Platelets: 177 Thousand/uL (ref 140–400)
RBC: 5.86 Million/uL — ABNORMAL HIGH (ref 4.20–5.80)
RDW: 14.4 % (ref 11.0–15.0)
Total Lymphocyte: 38.9 %
WBC: 7.2 Thousand/uL (ref 3.8–10.8)

## 2023-11-28 LAB — COMPREHENSIVE METABOLIC PANEL WITH GFR
AG Ratio: 1.9 (calc) (ref 1.0–2.5)
ALT: 27 U/L (ref 9–46)
AST: 24 U/L (ref 10–35)
Albumin: 4.2 g/dL (ref 3.6–5.1)
Alkaline phosphatase (APISO): 112 U/L (ref 35–144)
BUN/Creatinine Ratio: 16 (calc) (ref 6–22)
BUN: 22 mg/dL (ref 7–25)
CO2: 25 mmol/L (ref 20–32)
Calcium: 9.6 mg/dL (ref 8.6–10.3)
Chloride: 104 mmol/L (ref 98–110)
Creat: 1.39 mg/dL — ABNORMAL HIGH (ref 0.70–1.30)
Globulin: 2.2 g/dL (ref 1.9–3.7)
Glucose, Bld: 106 mg/dL (ref 65–139)
Potassium: 4.5 mmol/L (ref 3.5–5.3)
Sodium: 138 mmol/L (ref 135–146)
Total Bilirubin: 0.7 mg/dL (ref 0.2–1.2)
Total Protein: 6.4 g/dL (ref 6.1–8.1)
eGFR: 60 mL/min/1.73m2 (ref >=60)

## 2023-11-28 LAB — URIC ACID: Uric Acid, Serum: 4.6 mg/dL (ref 4.0–8.0)

## 2023-11-28 MED ORDER — PREDNISONE 5 MG PO TABS: ORAL_TABLET | ORAL | 0 refills | Status: AC

## 2023-11-28 NOTE — Progress Notes (Signed)
 Hemoglobin remains elevated at 17.4, creatinine is elevated at 1.39 and stable, LFTs are normal, uric acid is in the desirable range.  Please forward results to his PCP.

## 2023-12-14 ENCOUNTER — Other Ambulatory Visit: Payer: Self-pay | Admitting: Medical Genetics

## 2023-12-14 DIAGNOSIS — Z006 Encounter for examination for normal comparison and control in clinical research program: Secondary | ICD-10-CM

## 2023-12-26 ENCOUNTER — Ambulatory Visit: Attending: Cardiovascular Disease | Admitting: Cardiovascular Disease

## 2023-12-26 ENCOUNTER — Encounter: Payer: Self-pay | Admitting: Cardiovascular Disease

## 2023-12-26 VITALS — BP 134/88 | HR 57 | Resp 17 | Ht 70.0 in | Wt 250.0 lb

## 2023-12-26 DIAGNOSIS — E782 Mixed hyperlipidemia: Secondary | ICD-10-CM | POA: Diagnosis not present

## 2023-12-26 DIAGNOSIS — I10 Essential (primary) hypertension: Secondary | ICD-10-CM | POA: Insufficient documentation

## 2023-12-26 DIAGNOSIS — I255 Ischemic cardiomyopathy: Secondary | ICD-10-CM | POA: Diagnosis not present

## 2023-12-26 DIAGNOSIS — I25810 Atherosclerosis of coronary artery bypass graft(s) without angina pectoris: Secondary | ICD-10-CM | POA: Diagnosis not present

## 2023-12-26 DIAGNOSIS — E66812 Obesity, class 2: Secondary | ICD-10-CM | POA: Diagnosis present

## 2023-12-26 NOTE — Patient Instructions (Signed)
 Medication Instructions:   No changes  *If you need a refill on your cardiac medications before your next appointment, please call your pharmacy*   Lab Work: Not needed    Testing/Procedures: Not needed   Follow-Up: At [facility], you and your health needs are our priority.  As part of our continuing mission to provide you with exceptional heart care, we have created designated Provider Care Teams.  These Care Teams include your primary Cardiologist (physician) and Advanced Practice Providers (APPs -  Physician Assistants and Nurse Practitioners) who all work together to provide you with the care you need, when you need it.     Your next appointment:   12 month(s)  The format for your next appointment:   In Person  Provider:   Dorn Lesches, MD   Other Instructions  You have been referred to  [facility] healthy weight and wellness clinic -- the office will contact you with an appt.

## 2023-12-26 NOTE — Assessment & Plan Note (Signed)
 History of CAD status post CABG x 3 at [facility] 09/23/2020.  He underwent cardiac catheterization by Dr. Harding/11/25 revealing patent grafts, occluded native vessels with elevated LVEDP.  He denies chest pain or shortness of breath.

## 2023-12-26 NOTE — Assessment & Plan Note (Signed)
 History of essential hypertension with blood pressure measured today at 134/88.  He is on valsartan .

## 2023-12-26 NOTE — Assessment & Plan Note (Signed)
 Persistent moderate LV dysfunction with an EF of 40 to 45% by echo 09/27/2023 with grade 1 diastolic dysfunction.  He is minimally symptomatic and is on an ARB and a beta-blocker.

## 2023-12-26 NOTE — Assessment & Plan Note (Signed)
 History of mixed hyperlipidemia on high-dose atorvastatin  with lipid profile performed 10/02/2023 revealing total cholesterol 116, LDL 39 HDL 37.

## 2023-12-26 NOTE — Progress Notes (Signed)
 12/26/2023 Jerry Young.   [birth year]  [id number]  Primary Physician Alvia Corean CROME, FNP Primary Cardiologist: Dorn JINNY Lesches MD [facility], [place], [place]  HPI:  Jerry Young. is a 53 y.o.  moderately overweight divorced Caucasian male father of 3 living children (1 child deceased), grandfather of 7 grandchildren who currently is unemployed but previously was in armed forces operational officer.  He is referred by Corean Alvia, FNP to be established because of history of CAD.  I last saw him in the office 06/26/2023.  His risk factors include treated hypertension and untreated hyperlipidemia.  He is not diabetic.  He is never smoked.  There is no family history of heart disease.  Is never had a stroke.  He has CABG x 3 at [facility] in [place] 09/23/2020.  He did well for 2 years after that but over the last year has noticed increased fatigue and dyspnea.   I obtain a 2D echo on him 05/08/2023 revealed an EF of 40 to 45% with grade 2 diastolic dysfunction and segmental wall motion abnormalities.  This represents a decline in his EF of 50 to 55% by echo that was done at [facility] 07/02/2020.  Based on this, I decided to proceed with outpatient right left heart cath by Dr. Anner .   Right left heart cath was performed 05/26/23 revealing patent grafts with elevated LVEDP and wedge pressure.  Valsartan  was initiated.  He still complains of some fatigue and dyspnea however.  Since I saw him 6 months ago he remained stable.  He is gained 15 pounds which he wishes to lose.  He denies chest pain or shortness of breath.   Current Meds  Medication Sig   acetaminophen  (TYLENOL ) 500 MG tablet Take 1,500-2,000 mg by mouth every 6 (six) hours as needed for moderate pain (pain score 4-6) or mild pain (pain score 1-3). For pain   allopurinol  (ZYLOPRIM ) 300 MG tablet TAKE 2 TABLETS BY MOUTH DAILY.   atorvastatin  (LIPITOR) 80 MG tablet Take 80 mg  by mouth daily.   colchicine  0.6 MG tablet TAKE 1 TABLET BY MOUTH EVERY DAY   metoprolol  succinate (TOPROL -XL) 25 MG 24 hr tablet Take 2 tablets (50 mg total) by mouth daily.   probenecid  (BENEMID ) 500 MG tablet TAKE 1 TABLET BY MOUTH TWICE A DAY   valsartan  (DIOVAN ) 80 MG tablet Take 1 tablet (80 mg total) by mouth daily.   Vitamin D , Ergocalciferol , (DRISDOL ) 1.25 MG (50000 UNIT) CAPS capsule Take 1 capsule (50,000 Units total) by mouth every 7 (seven) days.     No Known Allergies  Social History   Socioeconomic History   Marital status: Divorced    Spouse name: Not on file   Number of children: 3   Years of education: Not on file   Highest education level: 12th grade  Occupational History   Not on file  Tobacco Use   Smoking status: Never    Passive exposure: Past   Smokeless tobacco: Current    Types: Chew   Tobacco comments:    I use chewing tobacco  Vaping Use   Vaping status: Never Used  Substance and Sexual Activity   Alcohol use: Yes    Comment: Varies but not often   Drug use: No   Sexual activity: Not Currently    Birth control/protection: None  Other Topics Concern   Not on file  Social History Narrative   Not on file  Social Drivers of Health   Financial Resource Strain: Medium Risk (10/01/2023)   Overall Financial Resource Strain (CARDIA)    Difficulty of Paying Living Expenses: Somewhat hard  Food Insecurity: Food Insecurity Present (10/01/2023)   Hunger Vital Sign    Worried About Running Out of Food in the Last Year: Sometimes true    Ran Out of Food in the Last Year: Never true  Transportation Needs: No Transportation Needs (10/01/2023)   PRAPARE - Administrator, Civil Service (Medical): No    Lack of Transportation (Non-Medical): No  Physical Activity: Insufficiently Active (10/01/2023)   Exercise Vital Sign    Days of Exercise per Week: 1 day    Minutes of Exercise per Session: 10 min  Stress: Stress Concern Present (10/01/2023)    [employer] of Occupational Health - Occupational Stress Questionnaire    Feeling of Stress: To some extent  Social Connections: Socially Isolated (10/01/2023)   Social Connection and Isolation Panel    Frequency of Communication with Friends and Family: Three times a week    Frequency of Social Gatherings with Friends and Family: Once a week    Attends Religious Services: Never    Database Administrator or Organizations: No    Attends Engineer, Structural: Not on file    Marital Status: Divorced  Intimate Partner Violence: Unknown (05/21/2021)   Received from [facility]   HITS    Physically Hurt: Not on file    Insult or Talk Down To: Not on file    Threaten Physical Harm: Not on file    Scream or Curse: Not on file     Review of Systems: General: negative for chills, fever, night sweats or weight changes.  Cardiovascular: negative for chest pain, dyspnea on exertion, edema, orthopnea, palpitations, paroxysmal nocturnal dyspnea or shortness of breath Dermatological: negative for rash Respiratory: negative for cough or wheezing Urologic: negative for hematuria Abdominal: negative for nausea, vomiting, diarrhea, bright red blood per rectum, melena, or hematemesis Neurologic: negative for visual changes, syncope, or dizziness All other systems reviewed and are otherwise negative except as noted above.    Blood pressure 134/88, pulse (!) 57, resp. rate 17, height 5' 10 (1.778 m), weight 250 lb (113.4 kg), SpO2 97%.  General appearance: alert and no distress Neck: no adenopathy, no carotid bruit, no JVD, supple, symmetrical, trachea midline, and thyroid not enlarged, symmetric, no tenderness/mass/nodules Lungs: clear to auscultation bilaterally Heart: regular rate and rhythm, S1, S2 normal, no murmur, click, rub or gallop Extremities: extremities normal, atraumatic, no cyanosis or edema Pulses: 2+ and symmetric Skin: Skin color, texture, turgor normal. No rashes or  lesions Neurologic: Grossly normal  EKG EKG Interpretation Date/Time:  Tuesday December 26 2023 11:04:35 EST Ventricular Rate:  77 PR Interval:  140 QRS Duration:  98 QT Interval:  388 QTC Calculation: 439 R Axis:   62  Text Interpretation: Normal sinus rhythm Nonspecific T wave abnormality When compared with ECG of 01-May-2023 11:24, No significant change was found Confirmed by Court Carrier [phone]) on 12/26/2023 11:32:39 AM    ASSESSMENT AND PLAN:   Primary hypertension History of essential hypertension with blood pressure measured today at 134/88.  He is on valsartan .  Mixed hyperlipidemia History of mixed hyperlipidemia on high-dose atorvastatin  with lipid profile performed 10/02/2023 revealing total cholesterol 116, LDL 39 HDL 37.  Coronary artery disease History of CAD status post CABG x 3 at [facility] 09/23/2020.  He underwent cardiac  catheterization by Dr. Harding/11/25 revealing patent grafts, occluded native vessels with elevated LVEDP.  He denies chest pain or shortness of breath.  Ischemic cardiomyopathy Persistent moderate LV dysfunction with an EF of 40 to 45% by echo 09/27/2023 with grade 1 diastolic dysfunction.  He is minimally symptomatic and is on an ARB and a beta-blocker.     Dorn DOROTHA Lesches MD FACP,FACC,FAHA, [facility] 12/26/2023 11:41 AM

## 2023-12-28 ENCOUNTER — Other Ambulatory Visit: Payer: Self-pay | Admitting: Rheumatology

## 2023-12-28 DIAGNOSIS — M1A09X1 Idiopathic chronic gout, multiple sites, with tophus (tophi): Secondary | ICD-10-CM

## 2023-12-28 NOTE — Telephone Encounter (Signed)
 Last Fill: 10/02/2023  Labs: 11/27/2023 Hemoglobin remains elevated at 17.4, creatinine is elevated at 1.39 and stable, LFTs are normal, uric acid is in the desirable range.   Next Visit: 02/28/2024  Last Visit: 11/28/2023  DX:  Idiopathic chronic gout of multiple sites with tophus   Current Dose per office note 11/28/2023: Allopurinol  600 mg daily   Okay to refill Allopurinol ?

## 2023-12-30 ENCOUNTER — Other Ambulatory Visit: Payer: Self-pay | Admitting: Physician Assistant

## 2023-12-30 DIAGNOSIS — M1A09X0 Idiopathic chronic gout, multiple sites, without tophus (tophi): Secondary | ICD-10-CM

## 2024-01-01 ENCOUNTER — Encounter (INDEPENDENT_AMBULATORY_CARE_PROVIDER_SITE_OTHER): Payer: Self-pay

## 2024-01-01 NOTE — Telephone Encounter (Signed)
 Last Fill: 09/18/2023  Labs: 11/27/2023 Hemoglobin remains elevated at 17.4, creatinine is elevated at 1.39 and stable, LFTs are normal, uric acid is in the desirable range. Please forward results to his PCP.   Next Visit: 02/28/2024  Last Visit: 11/28/2023  DX: Idiopathic chronic gout of multiple sites with tophus   Current Dose per office note 11/28/2023: colchicine  0.6 mg daily  Okay to refill Colchicine ?

## 2024-02-09 LAB — GENECONNECT MOLECULAR SCREEN: Genetic Analysis Overall Interpretation: NEGATIVE

## 2024-02-16 NOTE — Progress Notes (Unsigned)
 "  Office Visit Note  Patient: Jerry Young.             Date of Birth: 11-19-1970           MRN: [id number]             PCP: Alvia Corean CROME, FNP Referring: Alvia Corean CROME, FNP Visit Date: 02/28/2024 Occupation: Data Unavailable  Subjective:  Medication monitoring   History of Present Illness: Anthon Harpole. is a 54 y.o. male with history of gout and osteoarthritis.  Patient is currently taking Allopurinol  600 mg daily, colchicine  0.6 mg daily, and probenecid  500 mg 1 tablet twice daily.  He is tolerating combination therapy without any side effects and has not had any gaps in therapy.  He denies any signs or symptoms of gout since the last office visit.  If he experiences a general aching and joint stiffness but has not noticed any increase joint swelling.  He has not yet noticed any improvement in the tophi on his elbows and hands or knees.  He denies any new medical conditions.  Activities of Daily Living:  Patient reports morning stiffness for 5-10 minutes.   Patient Reports nocturnal pain.  Difficulty dressing/grooming: Reports Difficulty climbing stairs: Reports Difficulty getting out of chair: Reports Difficulty using hands for taps, buttons, cutlery, and/or writing: Reports  Review of Systems  Constitutional:  Positive for fatigue.  HENT:  Negative for mouth sores and mouth dryness.   Eyes:  Negative for dryness.  Respiratory:  Positive for shortness of breath.   Cardiovascular:  Positive for palpitations. Negative for chest pain.  Gastrointestinal:  Negative for blood in stool, constipation and diarrhea.  Endocrine: Negative for increased urination.  Genitourinary:  Negative for involuntary urination.  Musculoskeletal:  Positive for joint pain, joint pain, joint swelling, myalgias, muscle weakness, morning stiffness, muscle tenderness and myalgias. Negative for gait problem.  Skin:  Positive for sensitivity to sunlight. Negative for color change, rash and  hair loss.  Allergic/Immunologic: Negative for susceptible to infections.  Neurological:  Negative for dizziness and headaches.  Hematological:  Negative for swollen glands.  Psychiatric/Behavioral:  Positive for depressed mood and sleep disturbance. The patient is nervous/anxious.     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/2023   Primary hypertension 02/28/2023   Mixed hyperlipidemia 02/28/2023   Chronic gout of multiple sites 02/28/2023   Coronary artery disease 02/28/2023    Past Medical History:  Diagnosis Date   Coronary artery disease    Gout    Heart murmur    Hypertension    Myocardial infarction (HCC) 09/24/19   Had triple bypass surgery    Family History  Problem Relation Age of Onset   Arthritis Mother    Arthritis Father    Heart disease Father    Hypertension Father    Kidney disease Father    Arthritis Paternal Aunt    Heart disease Paternal Aunt    Hypertension Paternal Aunt    Arthritis Paternal Uncle    Heart disease Paternal Uncle    Hypertension Paternal Uncle    Arthritis Maternal Grandmother    Arthritis Maternal Grandfather    Cancer Maternal Grandfather    Heart disease Maternal Grandfather    Hypertension Maternal Grandfather  Arthritis Paternal Grandmother    Arthritis Paternal Grandfather    Heart disease Paternal Grandfather    Hypertension Paternal Grandfather    Past Surgical History:  Procedure Laterality Date   CORONARY ARTERY BYPASS GRAFT  09/24/19   Triple bypass   RIGHT/LEFT HEART CATH AND CORONARY/GRAFT ANGIOGRAPHY N/A 05/26/2023   Procedure: RIGHT/LEFT HEART CATH AND CORONARY/GRAFT ANGIOGRAPHY;  Surgeon: Anner Alm ORN, MD;  Location: [facility] INVASIVE CV LAB;  Service:  Cardiovascular;  Laterality: N/A;   triple bypass  09/23/2020   Social History[1] Social History   Social History Narrative   Not on file      There is no immunization history on file for this patient.   Objective: Vital Signs: BP 137/86   Pulse 73   Temp (!) 97.1 F (36.2 C)   Resp 14   Ht 5' 10.5 (1.791 m)   Wt 261 lb 12.8 oz (118.8 kg)   BMI 37.03 kg/m    Physical Exam Vitals and nursing note reviewed.  Constitutional:      Appearance: He is well-developed.  HENT:     Head: Normocephalic and atraumatic.  Eyes:     Conjunctiva/sclera: Conjunctivae normal.     Pupils: Pupils are equal, round, and reactive to light.  Cardiovascular:     Rate and Rhythm: Normal rate and regular rhythm.     Heart sounds: Normal heart sounds.  Pulmonary:     Effort: Pulmonary effort is normal.     Breath sounds: Normal breath sounds.  Abdominal:     General: Bowel sounds are normal.     Palpations: Abdomen is soft.  Musculoskeletal:     Cervical back: Normal range of motion and neck supple.  Skin:    General: Skin is warm and dry.     Capillary Refill: Capillary refill takes less than 2 seconds.  Neurological:     Mental Status: He is alert and oriented to person, place, and time.  Psychiatric:        Behavior: Behavior normal.      Musculoskeletal Exam: C-spine, thoracic spine, lumbar spine have good range of motion.  Some stiffness and discomfort with range of motion of both shoulders, right worse than left.  Large tophi on the extensor surface of both elbows, right larger than left.  Limited extension of both wrist joints.  Tophi overlying several PIP joints.  No inflammation of MCP or PIP joints.  Hip joints have good range of motion with no groin pain currently.  Limited extension of both knees with prepatellar tophi present bilaterally.  CDAI Exam: CDAI Score: -- Patient Global: --; Provider Global: -- Swollen: --; Tender: -- Joint Exam 02/28/2024   No joint exam has  been documented for this visit   There is currently no information documented on the homunculus. Go to the Rheumatology activity and complete the homunculus joint exam.  Investigation: No additional findings.  Imaging: No results found.  Recent Labs: Lab Results  Component Value Date   WBC 7.8 02/27/2024   HGB 17.2 (H) 02/27/2024   PLT 167 02/27/2024   NA 138 02/27/2024   K 4.2 02/27/2024   CL 103 02/27/2024   CO2 25 02/27/2024   GLUCOSE 129 (H) 02/27/2024   BUN 19 02/27/2024   CREATININE 1.19 02/27/2024   BILITOT 0.9 02/27/2024   ALKPHOS 96 10/02/2023   AST 48 (H) 02/27/2024   ALT 47 (H) 02/27/2024   PROT 6.2 02/27/2024   ALBUMIN 4.1 10/02/2023   CALCIUM  8.9 02/27/2024  GFRAA 54 (L) 07/24/2019    Speciality Comments: No specialty comments available.  Procedures:  No procedures performed Allergies: Patient has no known allergies.   Assessment / Plan:     Visit Diagnoses: Idiopathic chronic gout of multiple sites with tophus:  Diagnosed at age 41, family history of gout-paternal grandmother, tophi- elbows, both hands,knees (photos in encounter from 03/24/23), responsive to prednisone , hx of intermittent use of allopurinol :  Previous therapy includes indomethacin, colchicine , allopurinol , and prednisone .  He is not a good candidate for Uloric due to history of MI/triple bypass/hyperlipidemia/hypertension/CAD. Uric acid was 10.4 on 02/28/2023.  Allopurinol  and colchicine  were reinitiated at initial visit on 03/24/2023 (started at 150 mg x1wk, 300 mg daily x2 wk, then 450 mg daily as of 04/08/23)--uric acid improved to 7.8 on 04/10/2023.   Probenecid  500 mg 1 tablet twice daily was added on 04/20/23.  Uric acid was rechecked on 05/03/2023: Uric acid was 6.0. The dose of allopurinol  was increased to 600 mg daily starting 05/22/23--uric acid rechecked on 06/26/23: improved to 5.1.  Uric acid further improved to 4.6 on 08/15/2023 and on 11/27/2023 4.6. No medication changes have been made.   He has been taking combination therapy without any side effects and has not had any gaps.  He has not had any signs or symptoms of a gout flare.  He has not yet noticed any improvement in the tophi on his elbows, hands, or knee joints.  Discussed that ideally his uric acid level should be less than 4.  Discussed dietary recommendations again today in detail-he has occasional BBQ pork. Uric acid level was 5.1 on 02/27/2024.  AST and ALT were elevated likely due to statin use.  The dose of Lipitor was doubled from 40 mg to 80 mg daily.  He will be following up with his PCP in February 2026. He was advised to notify us  if he develops any signs or symptoms of a gout flare.  He will follow-up in the office in 6 months or sooner if needed.  Medication monitoring encounter - Allopurinol  600 mg daily, colchicine  0.6 mg daily, and probenecid  500 mg 1 tablet twice daily. Uric acid 4.6 on 11/27/23. Uric acid 5.1 on 02/27/24.    Chronic right shoulder pain - XR 08/24/2023 were unremarkable.  He had mild spurring but no glenohumeral or AC joint narrowing noted. right glenohumeral joint cortisone inj 08/24/2023.  No increased discomfort at this time.  Rheumatoid factor positive - RF 26 on 02/28/23-no clinical features of rheumatoid arthritis at this time  Pain in both hands - X-rays of both hands were consistent with gouty arthropathy and osteoarthritis overlap.  Erosive changes were noted. No active inflammation noted.  Primary osteoarthritis of both knees: He experiences chronic pain and stiffness in both knee joints.  He is difficulty rising from a seated position at times.  No effusion noted today.  Positive ANA (antinuclear antibody) - 02/28/23: ANA 1:40NH, uric acid 10.4, CRP 24.0, ESR 104, RF 26: No clinical features of systemic lupus.  Elevated C-reactive protein (CRP): ESR and CRP within normal limits on 05/22/2023   Elevated sed rate:ESR and CRP within normal limits on 05/22/2023   Other medical conditions  are listed as follows:  Primary hypertension: Blood pressure was 137/86 today in the office.  Mixed hyperlipidemia  History of MI (myocardial infarction) - Not a good candidate for Uloric.  History of coronary artery disease  Orders: No orders of the defined types were placed in this encounter.  No orders  of the defined types were placed in this encounter.  Follow-Up Instructions: Return in about 6 months (around 08/27/2024) for Gout, Osteoarthritis.   Waddell CHRISTELLA Craze, PA-C  Note - This record has been created using Dragon software.  Chart creation errors have been sought, but may not always  have been located. Such creation errors do not reflect on  the standard of medical care.     [1]  Social History Tobacco Use   Smoking status: Never    Passive exposure: Past   Smokeless tobacco: Current    Types: Chew   Tobacco comments:    I use chewing tobacco  Vaping Use   Vaping status: Never Used  Substance Use Topics   Alcohol use: Not Currently    Comment: Varies but not often   Drug use: No   "

## 2024-02-27 ENCOUNTER — Other Ambulatory Visit: Payer: Self-pay | Admitting: *Deleted

## 2024-02-27 DIAGNOSIS — Z5181 Encounter for therapeutic drug level monitoring: Secondary | ICD-10-CM

## 2024-02-27 DIAGNOSIS — M1A09X1 Idiopathic chronic gout, multiple sites, with tophus (tophi): Secondary | ICD-10-CM

## 2024-02-27 LAB — CBC WITH DIFFERENTIAL/PLATELET
Absolute Lymphocytes: 3081 {cells}/uL (ref 850–3900)
Absolute Monocytes: 671 {cells}/uL (ref 200–950)
Basophils Absolute: 39 {cells}/uL (ref 0–200)
Basophils Relative: 0.5 %
Eosinophils Absolute: 218 {cells}/uL (ref 15–500)
Eosinophils Relative: 2.8 %
HCT: 51.9 % — ABNORMAL HIGH (ref 39.4–51.1)
Hemoglobin: 17.2 g/dL — ABNORMAL HIGH (ref 13.2–17.1)
MCH: 29.1 pg (ref 27.0–33.0)
MCHC: 33.1 g/dL (ref 31.6–35.4)
MCV: 87.8 fL (ref 81.4–101.7)
MPV: 10.7 fL (ref 7.5–12.5)
Monocytes Relative: 8.6 %
Neutro Abs: 3791 {cells}/uL (ref 1500–7800)
Neutrophils Relative %: 48.6 %
Platelets: 167 Thousand/uL (ref 140–400)
RBC: 5.91 Million/uL — ABNORMAL HIGH (ref 4.20–5.80)
RDW: 13.8 % (ref 11.0–15.0)
Total Lymphocyte: 39.5 %
WBC: 7.8 Thousand/uL (ref 3.8–10.8)

## 2024-02-27 LAB — COMPREHENSIVE METABOLIC PANEL WITH GFR
AG Ratio: 1.7 (calc) (ref 1.0–2.5)
ALT: 47 U/L — ABNORMAL HIGH (ref 9–46)
AST: 48 U/L — ABNORMAL HIGH (ref 10–35)
Albumin: 3.9 g/dL (ref 3.6–5.1)
Alkaline phosphatase (APISO): 113 U/L (ref 35–144)
BUN: 19 mg/dL (ref 7–25)
CO2: 25 mmol/L (ref 20–32)
Calcium: 8.9 mg/dL (ref 8.6–10.3)
Chloride: 103 mmol/L (ref 98–110)
Creat: 1.19 mg/dL (ref 0.70–1.30)
Globulin: 2.3 g/dL (ref 1.9–3.7)
Glucose, Bld: 129 mg/dL — ABNORMAL HIGH (ref 65–99)
Potassium: 4.2 mmol/L (ref 3.5–5.3)
Sodium: 138 mmol/L (ref 135–146)
Total Bilirubin: 0.9 mg/dL (ref 0.2–1.2)
Total Protein: 6.2 g/dL (ref 6.1–8.1)
eGFR: 72 mL/min/1.73m2

## 2024-02-27 LAB — URIC ACID: Uric Acid, Serum: 5.1 mg/dL (ref 4.0–8.0)

## 2024-02-28 ENCOUNTER — Ambulatory Visit: Attending: Physician Assistant | Admitting: Physician Assistant

## 2024-02-28 ENCOUNTER — Ambulatory Visit: Payer: Self-pay | Admitting: Rheumatology

## 2024-02-28 ENCOUNTER — Encounter: Payer: Self-pay | Admitting: Physician Assistant

## 2024-02-28 VITALS — BP 137/86 | HR 73 | Temp 97.1°F | Resp 14 | Ht 70.5 in | Wt 261.8 lb

## 2024-02-28 DIAGNOSIS — M79641 Pain in right hand: Secondary | ICD-10-CM | POA: Insufficient documentation

## 2024-02-28 DIAGNOSIS — Z5181 Encounter for therapeutic drug level monitoring: Secondary | ICD-10-CM | POA: Insufficient documentation

## 2024-02-28 DIAGNOSIS — I10 Essential (primary) hypertension: Secondary | ICD-10-CM | POA: Insufficient documentation

## 2024-02-28 DIAGNOSIS — I252 Old myocardial infarction: Secondary | ICD-10-CM | POA: Insufficient documentation

## 2024-02-28 DIAGNOSIS — R7689 Other specified abnormal immunological findings in serum: Secondary | ICD-10-CM | POA: Insufficient documentation

## 2024-02-28 DIAGNOSIS — M170 Bilateral primary osteoarthritis of knee: Secondary | ICD-10-CM | POA: Insufficient documentation

## 2024-02-28 DIAGNOSIS — M1A09X1 Idiopathic chronic gout, multiple sites, with tophus (tophi): Secondary | ICD-10-CM | POA: Insufficient documentation

## 2024-02-28 DIAGNOSIS — G8929 Other chronic pain: Secondary | ICD-10-CM | POA: Insufficient documentation

## 2024-02-28 DIAGNOSIS — R7982 Elevated C-reactive protein (CRP): Secondary | ICD-10-CM | POA: Diagnosis not present

## 2024-02-28 DIAGNOSIS — E782 Mixed hyperlipidemia: Secondary | ICD-10-CM | POA: Insufficient documentation

## 2024-02-28 DIAGNOSIS — M79642 Pain in left hand: Secondary | ICD-10-CM | POA: Insufficient documentation

## 2024-02-28 DIAGNOSIS — Z8679 Personal history of other diseases of the circulatory system: Secondary | ICD-10-CM | POA: Insufficient documentation

## 2024-02-28 DIAGNOSIS — R700 Elevated erythrocyte sedimentation rate: Secondary | ICD-10-CM | POA: Insufficient documentation

## 2024-02-28 DIAGNOSIS — M25511 Pain in right shoulder: Secondary | ICD-10-CM | POA: Diagnosis not present

## 2024-02-28 MED ORDER — PROBENECID 500 MG PO TABS: 500.0000 mg | ORAL_TABLET | Freq: Two times a day (BID) | ORAL | 2 refills | Status: AC

## 2024-02-28 NOTE — Progress Notes (Signed)
 Hemoglobin remains high and is stable.  Glucose is mildly elevated.  Liver functions are mildly elevated (most likely due to statin use) uric acid is in the desirable range.  Patient should avoid all NSAIDs and alcohol use.  Please forward results to her PCP.

## 2024-03-20 ENCOUNTER — Other Ambulatory Visit: Payer: Self-pay | Admitting: Physician Assistant

## 2024-03-20 DIAGNOSIS — M1A09X1 Idiopathic chronic gout, multiple sites, with tophus (tophi): Secondary | ICD-10-CM

## 2024-03-20 NOTE — Telephone Encounter (Signed)
 Last Fill: 12/28/2023  Labs: 11/27/2023 Hemoglobin remains elevated at 17.4, creatinine is elevated at 1.39 and stable, LFTs are normal, uric acid is in the desirable range   Next Visit: 08/27/2024  Last Visit: 02/28/2024  DX: Idiopathic chronic gout of multiple sites with tophus:   Current Dose per office note 02/28/2024: Allopurinol  600 mg daily   Okay to refill Allopurinol ?

## 2024-04-04 ENCOUNTER — Encounter: Admitting: Family Medicine

## 2024-08-27 ENCOUNTER — Ambulatory Visit: Admitting: Rheumatology
